# Patient Record
Sex: Male | Born: 1975 | Race: Black or African American | Hispanic: No | Marital: Married
Health system: Midwestern US, Community
[De-identification: ages and names within clinical notes are randomized; demographics above are authoritative.]

## PROBLEM LIST (undated history)

## (undated) MED ORDER — AMOXICILLIN 500 MG CAP
500 mg | ORAL_CAPSULE | Freq: Three times a day (TID) | ORAL | Status: AC
Start: ? — End: 2012-06-19

---

## 2003-04-09 ENCOUNTER — Emergency Department (HOSPITAL_COMMUNITY): Admission: EM | Admit: 2003-04-09 | Discharge: 2003-04-10 | Payer: Self-pay | Admitting: Emergency Medicine

## 2004-03-08 ENCOUNTER — Emergency Department (HOSPITAL_COMMUNITY): Admission: EM | Admit: 2004-03-08 | Discharge: 2004-03-08 | Payer: Self-pay | Admitting: Emergency Medicine

## 2010-08-14 MED ORDER — NEOMYCIN-BACITRACIN-POLYMYXIN TOP. PACKET
CUTANEOUS | Status: DC
Start: 2010-08-14 — End: 2010-08-14

## 2010-08-14 MED ORDER — LIDOCAINE (PF) 10 MG/ML (1 %) IJ SOLN
10 mg/mL (1 %) | Freq: Once | INTRAMUSCULAR | Status: DC
Start: 2010-08-14 — End: 2010-08-14

## 2010-08-14 MED ORDER — HYDROCODONE-ACETAMINOPHEN 5 MG-500 MG TAB
5-500 mg | ORAL_TABLET | Freq: Four times a day (QID) | ORAL | Status: AC | PRN
Start: 2010-08-14 — End: ?

## 2010-08-14 NOTE — ED Notes (Signed)
Neosporin and wound care complete

## 2010-08-14 NOTE — ED Provider Notes (Addendum)
Patient is a 35 y.o. male presenting with skin laceration. The history is provided by the patient. No language interpreter was used.   Laceration   The incident occurred 1 to 2 hours ago. The laceration is located on the left leg. The laceration is 2 cm in size. Injury mechanism: wood staurs. Foreign body present: no. The pain is at a severity of 10/10. The pain is moderate. The pain has been constant since onset. Pertinent negatives include no numbness, no tingling and no loss of motion. The patient's last tetanus shot was less than 5 years ago.       No past medical history on file.     No past surgical history on file.      No family history on file.     History     Social History   ??? Marital Status: Single     Spouse Name: N/A     Number of Children: N/A   ??? Years of Education: N/A     Occupational History   ??? Not on file.     Social History Main Topics   ??? Smoking status: Current Some Day Smoker   ??? Smokeless tobacco: Not on file   ??? Alcohol Use: Yes   ??? Drug Use: Yes     Special: Marijuana   ??? Sexually Active:      Other Topics Concern   ??? Not on file     Social History Narrative   ??? No narrative on file                  ALLERGIES: Review of patient's allergies indicates no known allergies.      Review of Systems   Constitutional: Negative for fever and fatigue.   Eyes: Negative for visual disturbance.   Respiratory: Negative for chest tightness.    Cardiovascular: Negative for chest pain.   Gastrointestinal: Negative for abdominal pain.   Skin:        [Laceration L lower leg  Neurological: Negative for tingling and numbness.   Hematological: Negative for adenopathy.   Psychiatric/Behavioral: Negative for behavioral problems and agitation.   [all other systems reviewed and are negative        Filed Vitals:    08/14/10 1219 08/14/10 1432   BP: 114/71 112/67   Pulse: 86 89   Temp: 99.3 ??F (37.4 ??C) 98.9 ??F (37.2 ??C)   Resp: 16 16   Height: 5\' 10"  (1.778 m)    Weight: 156 lb 2 oz (70.818 kg)    SpO2: 98% 99%             Physical Exam   Constitutional: He is oriented to person, place, and time. He appears well-developed.   HENT:   Head: Normocephalic and atraumatic.   Eyes: Conjunctivae are normal.   Neck: Normal range of motion. Neck supple.   Cardiovascular: Normal rate and regular rhythm.    Pulmonary/Chest: Effort normal and breath sounds normal. He has no wheezes.   Abdominal: Soft. Bowel sounds are normal. There is no tenderness.   Musculoskeletal: Normal range of motion.   Lymphadenopathy:     He has no cervical adenopathy.   Neurological: He is alert and oriented to person, place, and time.   Skin: Skin is warm and dry.        1cm x 2.5cm laceration.L mid tibia. Overlying dermis is partially avulsed. Laceration .25cm depth no bone exposed. DNV LLE i ntact full mobility   Psychiatric: He  has a normal mood and affect. His behavior is normal. Judgment and thought content normal.        MDM    WOUND REPAIR  Date/Time: 08/14/2010 2:20 PM  Performed by: PASupervising provider: dr Edwena Blow  Location details: left leg  Wound length:2.6 - 7.5 cm  Anesthesia: local infiltration  Local anesthetic: lidocaine 1% without epinephrine  Anesthetic total: 10 ml  Foreign bodies: no foreign bodies  Irrigation solution: saline  Irrigation method: syringe  Skin closure: 5-0 nylon and Vicryl  Number of sutures: 9  Technique: simple  Dressing: antibiotic ointment and 4x4  My total time at bedside, performing this procedure was 16-30 minutes.

## 2010-08-14 NOTE — ED Notes (Signed)
Patient (s) Jonathan Ashley given copy of dc instructions and 1 script(s).  Patient (s) Jonathan Ashley verbalized understanding of instructions and script (s). Patient (s)Jonathan Ashley verbalized understanding of the importance of discussing medications with  his or her physician or clinic they will be following up with.  Patient alert and oriented and in no acute distress.  Patient discharged home ambulatory with self.

## 2010-08-14 NOTE — ED Notes (Cosign Needed)
Dressing bacitracin applied to sutured wound

## 2010-08-15 NOTE — ED Provider Notes (Signed)
I have personally seen and evaluated patient. I find the patient's history and physical exam are consistent with the PA's NP documentation. I agree with the care provided, treatments rendered, disposition and follow up plan.

## 2012-06-09 NOTE — Progress Notes (Signed)
Pt co sinus pressure, pain, chills, body aches, with nausea and slight fever x 1 day.  Pt was seen at the ER and needs a note to go back to work.

## 2012-06-09 NOTE — Progress Notes (Signed)
Subjective:      37 yo with cold symptoms and wanting to be cleared to return to work (did not bring form today)    Reports that he woke up this past Monday (5 days) and couldn't lift his arm went to work and arm started tightening up (right arm) and just left work and went to the emergency department at [employer], was diagnosed with a pinched nerve and was given a steroid rx and hydrocodone and was told to be out of work until Friday. Here to be cleared for work. Supposed to be going back to work Monday.     Works for [employer]    Also woke this morning with fever, cough, nasal congestion, nausea and emesis (had an episode of emesis while at the [employer])    No Known Allergies  No past medical history on file.  No past surgical history on file.  No family history on file.  History   Substance Use Topics   ??? Smoking status: Current Every Day Smoker -- 5.00 packs/day     Types: Cigarettes, Cigars   ??? Smokeless tobacco: Not on file   ??? Alcohol Use: Yes      Comment: 2 beers a day      Review of Systems    Pertinent items are noted in HPI.     Objective:     BP 132/74   Pulse 95   Temp(Src) 99.1 ??F (37.3 ??C) (Oral)   Ht 5' 8.75" (1.746 m)   Wt 162 lb (73.483 kg)   BMI 24.1 kg/m2   General appearance: fatigued, appears stated age  Eyes: conjunctivae/corneas clear.   Ears: right TM injected with effusion, left TM wnl, left effusion present  Nose: Nares normal. Septum midline. Mucosa normal. Opaque drainage, nasal turbinates red and swollen  Throat: posterior pharyngeal erythema  Lungs: clear to auscultation bilaterally  Heart: regular rate and rhythm, S1, S2 normal, no murmur, click, rub or gallop    Assessment/Plan:     1. Viral URI with cough     2. Otitis media of right ear  amoxicillin (AMOXIL) 500 mg capsule     Follow-up Disposition:  Return in about 1 day (around 06/11/2012) for eval re: neck pain.     Viral uri with right otitis media  Work note to return after Monday for eval with cav (pt to bring  paperwork required for him to return to work)  Pt to sign medical release to get records of evaluation from emergency department, should be clear to return to work if pain-free  Normal range of motion and normal strength

## 2012-06-09 NOTE — Progress Notes (Signed)
Statements below were documented by Dorien Chihuahua RN.  At the discharge station per provider the following was performed: Pt signed authorization for release of medical records from [facility].  Sent pt to registration to schedule f/u appt for Monday and reminded pt he must have return to work form with him when he returns.  Pt indicated understanding and had no questions.  Meeyah Ovitt Concha Se, RN
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
## 2012-06-11 NOTE — Telephone Encounter (Signed)
Patient called stating he's running a few minutes late for his 11 a.m. appointment today.    Thanks,

## 2014-03-28 ENCOUNTER — Encounter (HOSPITAL_BASED_OUTPATIENT_CLINIC_OR_DEPARTMENT_OTHER): Payer: Self-pay | Admitting: Emergency Medicine

## 2014-03-28 ENCOUNTER — Emergency Department (HOSPITAL_BASED_OUTPATIENT_CLINIC_OR_DEPARTMENT_OTHER): Payer: Self-pay

## 2014-03-28 ENCOUNTER — Emergency Department (HOSPITAL_BASED_OUTPATIENT_CLINIC_OR_DEPARTMENT_OTHER)
Admission: EM | Admit: 2014-03-28 | Discharge: 2014-03-28 | Disposition: A | Discharge status: Home / Self Care | Payer: Self-pay | Attending: Emergency Medicine | Admitting: Emergency Medicine

## 2014-03-28 DIAGNOSIS — X58XXXA Exposure to other specified factors, initial encounter: Secondary | ICD-10-CM | POA: Insufficient documentation

## 2014-03-28 DIAGNOSIS — S8992XA Unspecified injury of left lower leg, initial encounter: Secondary | ICD-10-CM | POA: Insufficient documentation

## 2014-03-28 DIAGNOSIS — Z720 Tobacco use: Secondary | ICD-10-CM | POA: Insufficient documentation

## 2014-03-28 DIAGNOSIS — Y9289 Other specified places as the place of occurrence of the external cause: Secondary | ICD-10-CM | POA: Insufficient documentation

## 2014-03-28 DIAGNOSIS — Y9372 Activity, wrestling: Secondary | ICD-10-CM | POA: Insufficient documentation

## 2014-03-28 DIAGNOSIS — Y998 Other external cause status: Secondary | ICD-10-CM | POA: Insufficient documentation

## 2014-03-28 MED ORDER — OXYCODONE-ACETAMINOPHEN 5-325 MG PO TABS: 1.0000 | ORAL_TABLET | Freq: Four times a day (QID) | ORAL | Status: DC | PRN

## 2014-03-28 MED ORDER — MORPHINE SULFATE 4 MG/ML IJ SOLN
4.0000 mg | Freq: Once | INTRAMUSCULAR | Status: AC
  Administered 2014-03-28: 4 mg via INTRAVENOUS
  Filled 2014-03-28: qty 1

## 2014-03-28 MED ORDER — ONDANSETRON HCL 4 MG/2ML IJ SOLN
4.0000 mg | Freq: Once | INTRAMUSCULAR | Status: AC
  Administered 2014-03-28: 4 mg via INTRAVENOUS

## 2014-03-28 MED ORDER — ONDANSETRON HCL 4 MG/2ML IJ SOLN
INTRAMUSCULAR | Status: AC
  Filled 2014-03-28: qty 2

## 2014-03-28 NOTE — Discharge Instructions (Signed)
Take motrin for pain.   Take percocet for severe pain.   Stay hydrated.   Keep off your left leg. Use crutches.   See orthopedic doctor in a week to get follow up. You may need MRI.   Return to ER if you have severe pain, leg numbness.

## 2014-03-28 NOTE — ED Provider Notes (Signed)
CSN: [id number]     Arrival date & time 03/28/14  [id number] History   First MD Initiated Contact with Patient 03/28/14 [phone]     Chief Complaint  Patient presents with  . Knee Injury     (Consider location/radiation/quality/duration/timing/severity/associated sxs/prior Treatment) The history is provided by the patient.  Herbert Shelton is a 38 y.o. male here with left knee pain. Was drinking alcohol with friends yesterday and was wrestling. Denies falls or injury on the knee. States that he may have twisted left knee. Denies head or neck or chest or abdominal injury. Woke up this morning with severe left knee pain.    No past medical history on file. No past surgical history on file. History reviewed. No pertinent family history. History  Substance Use Topics  . Smoking status: Current Every Day Smoker  . Smokeless tobacco: Not on file  . Alcohol Use: Not on file    Review of Systems  Musculoskeletal:       L knee pain   All other systems reviewed and are negative.     Allergies  Review of patient's allergies indicates no known allergies.  Home Medications   Prior to Admission medications   Not on File   BP 141/99 mmHg  Pulse 90  Temp(Src) 98.3 F (36.8 C) (Oral)  Resp 24  SpO2 100% Physical Exam  Constitutional: He is oriented to person, place, and time.  Uncomfortable   HENT:  Head: Normocephalic and atraumatic.  Mouth/Throat: Oropharynx is clear and moist.  Eyes: Conjunctivae are normal. Pupils are equal, round, and reactive to light.  Neck: Normal range of motion.  Cardiovascular: Normal rate, regular rhythm and normal heart sounds.   Pulmonary/Chest: Effort normal and breath sounds normal. No respiratory distress. He has no wheezes. He has no rales.  Abdominal: Soft. Bowel sounds are normal. He exhibits no distension. There is no tenderness. There is no rebound.  Musculoskeletal:  L knee swollen, tender medial aspect. Effusion medial aspect. Difficult to  range due to pain. Able to dorsiflex and plantar flex. No tenderness tib/fib or femur or hip. Pelvis stable. No other obvious extremity injuries   Neurological: He is alert and oriented to person, place, and time.  Skin: Skin is warm and dry.  Psychiatric: He has a normal mood and affect. His behavior is normal. Judgment and thought content normal.  Nursing note and vitals reviewed.   ED Course  Procedures (including critical care time) Labs Review Labs Reviewed - No data to display  Imaging Review Dg Knee Complete 4 Views Left  03/28/2014   CLINICAL DATA:  Left knee pain following injury, initial encounter  EXAM: LEFT KNEE - COMPLETE 4+ VIEW  COMPARISON:  None.  FINDINGS: There is no evidence of fracture, dislocation, or joint effusion. There is no evidence of arthropathy or other focal bone abnormality. Soft tissues are unremarkable.  IMPRESSION: No acute abnormality noted.   Electronically Signed   By: Alcide CleverMark  Lukens M.D.   On: 03/28/2014 08:03     EKG Interpretation None      MDM   Final diagnoses:  Knee injury, left, initial encounter    Herbert Shelton is a 38 y.o. male here with L knee pain. Likely meniscus injury vs ligamentous injury. Will get xrays to r/o obvious fractures. I doubt tibial plateau fracture as he didn't actually fall on the knee. Will likely place on knee brace and give crutches and ortho f/u.   8:20 AM Xray unremarkable. Placed on knee immobilizer,  given crutches. Told him to see ortho to get MRI to look at ligaments and meniscus.     Richardean Canalavid H Yao, MD 03/28/14 [phone]

## 2014-03-28 NOTE — ED Notes (Signed)
Pt given ice pack

## 2014-03-28 NOTE — ED Notes (Signed)
Left knee injury while playing around yesterday.  Pt having severe pain to left knee.  Some swelling noted, no deformities.

## 2014-04-08 ENCOUNTER — Ambulatory Visit (INDEPENDENT_AMBULATORY_CARE_PROVIDER_SITE_OTHER): Payer: Self-pay | Admitting: Family Medicine

## 2014-04-08 ENCOUNTER — Encounter: Payer: Self-pay | Admitting: Family Medicine

## 2014-04-08 VITALS — BP 128/73 | HR 101 | Ht 70.0 in | Wt 175.0 lb

## 2014-04-08 DIAGNOSIS — S8992XA Unspecified injury of left lower leg, initial encounter: Secondary | ICD-10-CM

## 2014-04-08 MED ORDER — OXYCODONE-ACETAMINOPHEN 5-325 MG PO TABS: 1.0000 | ORAL_TABLET | Freq: Four times a day (QID) | ORAL | Status: DC | PRN

## 2014-04-08 NOTE — Patient Instructions (Signed)
I'm concerned you tore your ACL and medial meniscus. We will go ahead with an MRI to assess - i'll call you the business day after this. Wear immobilizer but come out of this 2-3 times a day to work on motion, straight leg raises, quad flexion as we showed. Ok to take this off for the exercises, to ice 15 minutes 3-4 times a day. ACE wrap under the immobilizer. Elevate above the level of your heart to help with swelling. Ibuprofen 600mg  three times a day with food for pain and inflammation. Oxycodone every 6 hours as needed for severe pain (no driving on this).

## 2014-04-09 ENCOUNTER — Other Ambulatory Visit: Payer: Self-pay | Admitting: Family Medicine

## 2014-04-09 DIAGNOSIS — S8992XA Unspecified injury of left lower leg, initial encounter: Secondary | ICD-10-CM | POA: Insufficient documentation

## 2014-04-09 DIAGNOSIS — Z9889 Other specified postprocedural states: Secondary | ICD-10-CM

## 2014-04-09 NOTE — Assessment & Plan Note (Signed)
consistent with likely ACL, medial meniscus tears and MCL sprain.  Will move forward with MRI.  Icing, ACE wrap, motion exercises, elevation in meantime.  Immobilizer as needed for stability.  Quad strengthening exercises reviewed also.  Ibuprofen, oxycodone.

## 2014-04-09 NOTE — Progress Notes (Addendum)
PCP: No primary care provider on file.  Subjective:   HPI: Patient is a 39 y.o. male here for left knee injury.  Patient reports on 12/24 he was horsing around with some friends when he believes he twisted his left knee. Felt it give out. Lots of swelling. Went to ED and had x-rays that were negative. Placed in immobilizer with crutches and referred here. Been taking ibuprofen and oxycodone. No prior injuries.  No past medical history on file.  No current outpatient prescriptions on file prior to visit.   No current facility-administered medications on file prior to visit.    No past surgical history on file.  No Known Allergies  History   Social History  . Marital Status: Single    Spouse Name: N/A    Number of Children: N/A  . Years of Education: N/A   Occupational History  . Not on file.   Social History Main Topics  . Smoking status: Current Every Day Smoker    Types: Cigars  . Smokeless tobacco: Not on file     Comment: 2-3 cigars per day  . Alcohol Use: Not on file  . Drug Use: Not on file  . Sexual Activity: Not on file   Other Topics Concern  . Not on file   Social History Narrative    No family history on file.  BP 128/73 mmHg  Pulse 101  Ht 5\' 10"  (1.778 m)  Wt 175 lb (79.379 kg)  BMI 25.11 kg/m2  Review of Systems: See HPI above.    Objective:  Physical Exam:  Gen: NAD  Left knee: Mod effusion.  No bruising, other deformity. TTP medial joint line and suprapatellar pouch.  No lateral, other tenderness. Full extension.  Can only flex to about 50 degrees due to pain, stiffness. Unable to adequately assess ant drawer and post drawer.  2+ lachmanns.  Pain with valgus testing.   Pain with a modified mcmurrays and apleys.  Negative patellar apprehension.   NV intact distally.    Assessment & Plan:  1. Left knee injury - consistent with likely ACL, medial meniscus tears and MCL sprain.  Will move forward with MRI.  Icing, ACE wrap,  motion exercises, elevation in meantime.  Immobilizer as needed for stability.  Quad strengthening exercises reviewed also.  Ibuprofen, oxycodone.    Addendum:  MRI was reviewed and discussed with patient - Evidence that he had dislocated his patella based on disruption of MPFL, VMO stripping, contusions of femoral condyle and medial patellar facet.  He is already 3 weeks out from this injury having used immobilizer.  Will switch to a knee brace for support, start physical therapy to regain motion and strength slowly (start with quad work, not flexing beyond 90 degrees).  F/u when he is 6 weeks out.

## 2014-04-09 NOTE — Addendum Note (Signed)
Addended by: Kathi SimpersWISE, Taylinn Brabant F on: 04/09/2014 10:15 AM   Modules accepted: Orders

## 2014-04-12 ENCOUNTER — Ambulatory Visit (HOSPITAL_BASED_OUTPATIENT_CLINIC_OR_DEPARTMENT_OTHER)
Admission: RE | Admit: 2014-04-12 | Discharge: 2014-04-12 | Disposition: A | Discharge status: Home / Self Care | Payer: Managed Care, Other (non HMO) | Source: Ambulatory Visit | Attending: Family Medicine | Admitting: Family Medicine

## 2014-04-12 DIAGNOSIS — Z9889 Other specified postprocedural states: Secondary | ICD-10-CM

## 2014-04-12 DIAGNOSIS — S8982XA Other specified injuries of left lower leg, initial encounter: Secondary | ICD-10-CM | POA: Diagnosis not present

## 2014-04-12 DIAGNOSIS — M25562 Pain in left knee: Secondary | ICD-10-CM | POA: Insufficient documentation

## 2014-04-12 DIAGNOSIS — Y9372 Activity, wrestling: Secondary | ICD-10-CM | POA: Diagnosis not present

## 2014-04-12 DIAGNOSIS — S8992XA Unspecified injury of left lower leg, initial encounter: Secondary | ICD-10-CM

## 2014-04-15 NOTE — Addendum Note (Signed)
Addended by: Kathi SimpersWISE, Shamir Sedlar F on: 04/15/2014 12:02 PM   Modules accepted: Orders

## 2014-04-18 NOTE — Addendum Note (Signed)
Addended by: Lenda KelpHUDNALL, Hassani Sliney R on: 04/18/2014 10:42 AM   Modules accepted: Kipp BroodSmartSet

## 2014-04-20 ENCOUNTER — Emergency Department (HOSPITAL_BASED_OUTPATIENT_CLINIC_OR_DEPARTMENT_OTHER)
Admission: EM | Admit: 2014-04-20 | Discharge: 2014-04-20 | Disposition: A | Discharge status: Home / Self Care | Payer: Managed Care, Other (non HMO) | Attending: Emergency Medicine | Admitting: Emergency Medicine

## 2014-04-20 ENCOUNTER — Encounter (HOSPITAL_BASED_OUTPATIENT_CLINIC_OR_DEPARTMENT_OTHER): Payer: Self-pay | Admitting: *Deleted

## 2014-04-20 DIAGNOSIS — Z720 Tobacco use: Secondary | ICD-10-CM | POA: Insufficient documentation

## 2014-04-20 DIAGNOSIS — M25562 Pain in left knee: Secondary | ICD-10-CM | POA: Diagnosis not present

## 2014-04-20 DIAGNOSIS — Z760 Encounter for issue of repeat prescription: Secondary | ICD-10-CM

## 2014-04-20 MED ORDER — OXYCODONE-ACETAMINOPHEN 5-325 MG PO TABS: 1.0000 | ORAL_TABLET | Freq: Four times a day (QID) | ORAL | Status: DC | PRN

## 2014-04-20 MED ORDER — IBUPROFEN 800 MG PO TABS: 800.0000 mg | ORAL_TABLET | Freq: Three times a day (TID) | ORAL | Status: DC | PRN

## 2014-04-20 NOTE — ED Notes (Signed)
Pt reports he was seen here Christmas for left knee injury- is followed by Dr Pearletha ForgeHudnall- out of pain meds

## 2014-04-20 NOTE — Discharge Instructions (Signed)
Take ibuprofen as prescribed. Take percocet for severe pain only. No driving or operating heavy machinery while taking percocet. This medication may cause drowsiness.  Knee Pain The knee is the complex joint between your thigh and your lower leg. It is made up of bones, tendons, ligaments, and cartilage. The bones that make up the knee are:  The femur in the thigh.  The tibia and fibula in the lower leg.  The patella or kneecap riding in the groove on the lower femur. CAUSES  Knee pain is a common complaint with many causes. A few of these causes are:  Injury, such as:  A ruptured ligament or tendon injury.  Torn cartilage.  Medical conditions, such as:  Gout  Arthritis  Infections  Overuse, over training, or overdoing a physical activity. Knee pain can be minor or severe. Knee pain can accompany debilitating injury. Minor knee problems often respond well to self-care measures or get well on their own. More serious injuries may need medical intervention or even surgery. SYMPTOMS The knee is complex. Symptoms of knee problems can vary widely. Some of the problems are:  Pain with movement and weight bearing.  Swelling and tenderness.  Buckling of the knee.  Inability to straighten or extend your knee.  Your knee locks and you cannot straighten it.  Warmth and redness with pain and fever.  Deformity or dislocation of the kneecap. DIAGNOSIS  Determining what is wrong may be very straight forward such as when there is an injury. It can also be challenging because of the complexity of the knee. Tests to make a diagnosis may include:  Your caregiver taking a history and doing a physical exam.  Routine X-rays can be used to rule out other problems. X-rays will not reveal a cartilage tear. Some injuries of the knee can be diagnosed by:  Arthroscopy a surgical technique by which a small video camera is inserted through tiny incisions on the sides of the knee. This procedure  is used to examine and repair internal knee joint problems. Tiny instruments can be used during arthroscopy to repair the torn knee cartilage (meniscus).  Arthrography is a radiology technique. A contrast liquid is directly injected into the knee joint. Internal structures of the knee joint then become visible on X-ray film.  An MRI scan is a non X-ray radiology procedure in which magnetic fields and a computer produce two- or three-dimensional images of the inside of the knee. Cartilage tears are often visible using an MRI scanner. MRI scans have largely replaced arthrography in diagnosing cartilage tears of the knee.  Blood work.  Examination of the fluid that helps to lubricate the knee joint (synovial fluid). This is done by taking a sample out using a needle and a syringe. TREATMENT The treatment of knee problems depends on the cause. Some of these treatments are:  Depending on the injury, proper casting, splinting, surgery, or physical therapy care will be needed.  Give yourself adequate recovery time. Do not overuse your joints. If you begin to get sore during workout routines, back off. Slow down or do fewer repetitions.  For repetitive activities such as cycling or running, maintain your strength and nutrition.  Alternate muscle groups. For example, if you are a weight lifter, work the upper body on one day and the lower body the next.  Either tight or weak muscles do not give the proper support for your knee. Tight or weak muscles do not absorb the stress placed on the knee joint.  Keep the muscles surrounding the knee strong.  Take care of mechanical problems.  If you have flat feet, orthotics or special shoes may help. See your caregiver if you need help.  Arch supports, sometimes with wedges on the inner or outer aspect of the heel, can help. These can shift pressure away from the side of the knee most bothered by osteoarthritis.  A brace called an "unloader" brace also may be  used to help ease the pressure on the most arthritic side of the knee.  If your caregiver has prescribed crutches, braces, wraps or ice, use as directed. The acronym for this is PRICE. This means protection, rest, ice, compression, and elevation.  Nonsteroidal anti-inflammatory drugs (NSAIDs), can help relieve pain. But if taken immediately after an injury, they may actually increase swelling. Take NSAIDs with food in your stomach. Stop them if you develop stomach problems. Do not take these if you have a history of ulcers, stomach pain, or bleeding from the bowel. Do not take without your caregiver's approval if you have problems with fluid retention, heart failure, or kidney problems.  For ongoing knee problems, physical therapy may be helpful.  Glucosamine and chondroitin are over-the-counter dietary supplements. Both may help relieve the pain of osteoarthritis in the knee. These medicines are different from the usual anti-inflammatory drugs. Glucosamine may decrease the rate of cartilage destruction.  Injections of a corticosteroid drug into your knee joint may help reduce the symptoms of an arthritis flare-up. They may provide pain relief that lasts a few months. You may have to wait a few months between injections. The injections do have a small increased risk of infection, water retention, and elevated blood sugar levels.  Hyaluronic acid injected into damaged joints may ease pain and provide lubrication. These injections may work by reducing inflammation. A series of shots may give relief for as long as 6 months.  Topical painkillers. Applying certain ointments to your skin may help relieve the pain and stiffness of osteoarthritis. Ask your pharmacist for suggestions. Many over the-counter products are approved for temporary relief of arthritis pain.  In some countries, doctors often prescribe topical NSAIDs for relief of chronic conditions such as arthritis and tendinitis. A review of  treatment with NSAID creams found that they worked as well as oral medications but without the serious side effects. PREVENTION  Maintain a healthy weight. Extra pounds put more strain on your joints.  Get strong, stay limber. Weak muscles are a common cause of knee injuries. Stretching is important. Include flexibility exercises in your workouts.  Be smart about exercise. If you have osteoarthritis, chronic knee pain or recurring injuries, you may need to change the way you exercise. This does not mean you have to stop being active. If your knees ache after jogging or playing basketball, consider switching to swimming, water aerobics, or other low-impact activities, at least for a few days a week. Sometimes limiting high-impact activities will provide relief.  Make sure your shoes fit well. Choose footwear that is right for your sport.  Protect your knees. Use the proper gear for knee-sensitive activities. Use kneepads when playing volleyball or laying carpet. Buckle your seat belt every time you drive. Most shattered kneecaps occur in car accidents.  Rest when you are tired. SEEK MEDICAL CARE IF:  You have knee pain that is continual and does not seem to be getting better.  SEEK IMMEDIATE MEDICAL CARE IF:  Your knee joint feels hot to the touch and you have a  high fever. MAKE SURE YOU:   Understand these instructions.  Will watch your condition.  Will get help right away if you are not doing well or get worse. Document Released: 01/16/2007 Document Revised: 06/13/2011 Document Reviewed: 01/16/2007 [facility] Patient Information 2015 [place], [place]. This information is not intended to replace advice given to you by your health care provider. Make sure you discuss any questions you have with your health care provider.  Medication Refill, Emergency Department We have refilled your medication today as a courtesy to you. It is best for your medical care, however, to take care of getting  refills done through your primary caregiver's office. They have your records and can do a better job of follow-up than we can in the emergency department. On maintenance medications, we often only prescribe enough medications to get you by until you are able to see your regular caregiver. This is a more expensive way to refill medications. In the future, please plan for refills so that you will not have to use the emergency department for this. Thank you for your help. Your help allows us to better take care of the daily emergencies that enter our department. Document Released: 07/08/2003 Document Revised: 06/13/2011 Document Reviewed: 06/28/2013 [facility] Patient Information 2015 [place], [place]. This information is not intended to replace advice given to you by your health care provider. Make sure you discuss any questions you have with your health care provider.

## 2014-04-20 NOTE — ED Provider Notes (Signed)
CSN: [id number]     Arrival date & time 04/20/14  1706 History   First MD Initiated Contact with Patient 04/20/14 1707     Chief Complaint  Patient presents with  . Medication Refill     (Consider location/radiation/quality/duration/timing/severity/associated sxs/prior Treatment) HPI Comments: 39 year old male presenting requesting a medication refill on Percocet. Patient was seen in the emergency department on 03/28/14 with left knee pain after an injury and advised to follow-up with orthopedics. Patient followed up with Dr. Pearletha Forge on 04/08/2014, had an MRI and 04/12/2014 which showed concerns of a patella dislocation injury. He was placed in knee brace described Percocet. Patient reports he ran out of the Percocet 2 days ago. He tried calling Dr. Lazaro Arms office, however no one was there at the time and has not received a phone call back. He states he has been taking Percocet and ibuprofen. No new injury or trauma.  The history is provided by the patient.    History reviewed. No pertinent past medical history. History reviewed. No pertinent past surgical history. No family history on file. History  Substance Use Topics  . Smoking status: Current Every Day Smoker    Types: Cigars, Cigarettes  . Smokeless tobacco: Never Used     Comment: 2-3 cigars per day  . Alcohol Use: Yes     Comment: occasional    Review of Systems  Constitutional: Negative.   HENT: Negative.   Respiratory: Negative.   Musculoskeletal:       + L knee pain.  Skin: Negative.   Neurological: Negative for numbness.      Allergies  Review of patient's allergies indicates no known allergies.  Home Medications   Prior to Admission medications   Medication Sig Start Date End Date Taking? Authorizing Provider  ibuprofen (ADVIL,MOTRIN) 800 MG tablet Take 1 tablet (800 mg total) by mouth every 8 (eight) hours as needed. 04/20/14   Kathrynn Speed, PA-C  oxyCODONE-acetaminophen (PERCOCET) 5-325 MG per tablet  Take 1-2 tablets by mouth every 6 (six) hours as needed for severe pain. 04/20/14   Dorann Davidson M Onell Mcmath, PA-C   BP 141/85 mmHg  Pulse 106  Temp(Src) 98 F (36.7 C) (Oral)  Resp 18  Ht  (1.778 m)  Wt 177 lb (80.287 kg)  BMI 25.40 kg/m2  SpO2 99% Physical Exam  Constitutional: He is oriented to person, place, and time. He appears well-developed and well-nourished. No distress.  HENT:  Head: Normocephalic and atraumatic.  Eyes: Conjunctivae and EOM are normal.  Neck: Normal range of motion. Neck supple.  Cardiovascular: Normal rate, regular rhythm and normal heart sounds.   Pulmonary/Chest: Effort normal and breath sounds normal.  Musculoskeletal:  L knee swollen around patella, medially and laterally. Tender throughout. No erythema or warmth. ROM limited due to pain.  Neurological: He is alert and oriented to person, place, and time.  Skin: Skin is warm and dry.  Psychiatric: He has a normal mood and affect. His behavior is normal.  Nursing note and vitals reviewed.   ED Course  Procedures (including critical care time) Labs Review Labs Reviewed - No data to display  Imaging Review No results found.   EKG Interpretation None      MDM   Final diagnoses:  Medication refill  Knee pain, left   Patient in no apparent distress. Vital signs stable. No tachycardia on my examination. On chart review, patient was prescribed 60 tablets, a 7 day supply of Percocet on 04/08/2014. I discussed with patient that  I can't prescribe only a short course of this medication and he will need to follow-up with Dr. Pearletha ForgeHudnall. Prescription for 10 Percocet given along with 800 mg ibuprofen. Stable for discharge. Return precautions given. Patient states understanding of treatment care plan and is agreeable.  Kathrynn SpeedRobyn M Jaelah Hauth, PA-C 04/20/14 1732  Ethelda ChickMartha K Linker, MD 04/20/14 [phone]

## 2014-04-21 ENCOUNTER — Other Ambulatory Visit: Payer: Self-pay | Admitting: Family Medicine

## 2014-04-21 MED ORDER — HYDROCODONE-ACETAMINOPHEN 5-325 MG PO TABS: 1.0000 | ORAL_TABLET | Freq: Four times a day (QID) | ORAL | Status: DC | PRN

## 2014-04-21 NOTE — Progress Notes (Signed)
Patient called requesting pain medication refill.  Will step down to Norco from Percocet, no refills.  If he requests additional medication would then switch to tramadol.  Narcotics database reviewed prior to script.

## 2014-04-22 ENCOUNTER — Ambulatory Visit: Payer: Managed Care, Other (non HMO) | Attending: Family Medicine

## 2014-04-22 DIAGNOSIS — M25562 Pain in left knee: Secondary | ICD-10-CM | POA: Insufficient documentation

## 2014-04-22 DIAGNOSIS — M25662 Stiffness of left knee, not elsewhere classified: Secondary | ICD-10-CM | POA: Insufficient documentation

## 2014-04-22 DIAGNOSIS — S8992XD Unspecified injury of left lower leg, subsequent encounter: Secondary | ICD-10-CM | POA: Insufficient documentation

## 2014-04-24 ENCOUNTER — Ambulatory Visit: Payer: Managed Care, Other (non HMO) | Admitting: Rehabilitation

## 2014-04-24 DIAGNOSIS — S8992XD Unspecified injury of left lower leg, subsequent encounter: Secondary | ICD-10-CM | POA: Diagnosis not present

## 2014-04-28 ENCOUNTER — Ambulatory Visit: Payer: Managed Care, Other (non HMO)

## 2014-04-28 DIAGNOSIS — S8992XD Unspecified injury of left lower leg, subsequent encounter: Secondary | ICD-10-CM | POA: Diagnosis not present

## 2014-05-01 ENCOUNTER — Other Ambulatory Visit: Payer: Self-pay | Admitting: Family Medicine

## 2014-05-01 ENCOUNTER — Ambulatory Visit: Payer: Managed Care, Other (non HMO) | Admitting: Rehabilitation

## 2014-05-01 DIAGNOSIS — S8992XD Unspecified injury of left lower leg, subsequent encounter: Secondary | ICD-10-CM | POA: Diagnosis not present

## 2014-05-01 MED ORDER — IBUPROFEN 800 MG PO TABS: 800.0000 mg | ORAL_TABLET | Freq: Three times a day (TID) | ORAL | Status: DC | PRN

## 2014-05-01 MED ORDER — TRAMADOL HCL 50 MG PO TABS: 50.0000 mg | ORAL_TABLET | Freq: Three times a day (TID) | ORAL | Status: DC | PRN

## 2014-05-01 NOTE — Progress Notes (Signed)
Patient requesting refills on medicines - rx tramadol and ibuprofen.

## 2014-05-05 ENCOUNTER — Ambulatory Visit: Payer: Managed Care, Other (non HMO)

## 2014-05-08 ENCOUNTER — Ambulatory Visit: Payer: Managed Care, Other (non HMO) | Attending: Family Medicine

## 2014-05-08 DIAGNOSIS — M25562 Pain in left knee: Secondary | ICD-10-CM | POA: Insufficient documentation

## 2014-05-08 DIAGNOSIS — M25662 Stiffness of left knee, not elsewhere classified: Secondary | ICD-10-CM | POA: Diagnosis not present

## 2014-05-12 ENCOUNTER — Ambulatory Visit: Payer: Managed Care, Other (non HMO) | Admitting: Rehabilitation

## 2014-05-12 DIAGNOSIS — M25562 Pain in left knee: Secondary | ICD-10-CM | POA: Diagnosis not present

## 2014-05-15 ENCOUNTER — Ambulatory Visit: Payer: Managed Care, Other (non HMO)

## 2014-05-15 DIAGNOSIS — M25562 Pain in left knee: Secondary | ICD-10-CM | POA: Diagnosis not present

## 2014-05-16 ENCOUNTER — Emergency Department (HOSPITAL_BASED_OUTPATIENT_CLINIC_OR_DEPARTMENT_OTHER)
Admission: EM | Admit: 2014-05-16 | Discharge: 2014-05-16 | Disposition: A | Discharge status: Home / Self Care | Payer: Managed Care, Other (non HMO) | Attending: Emergency Medicine | Admitting: Emergency Medicine

## 2014-05-16 ENCOUNTER — Encounter (HOSPITAL_BASED_OUTPATIENT_CLINIC_OR_DEPARTMENT_OTHER): Payer: Self-pay | Admitting: Emergency Medicine

## 2014-05-16 DIAGNOSIS — Z720 Tobacco use: Secondary | ICD-10-CM | POA: Diagnosis not present

## 2014-05-16 DIAGNOSIS — J069 Acute upper respiratory infection, unspecified: Secondary | ICD-10-CM | POA: Insufficient documentation

## 2014-05-16 DIAGNOSIS — G8929 Other chronic pain: Secondary | ICD-10-CM | POA: Insufficient documentation

## 2014-05-16 DIAGNOSIS — M25562 Pain in left knee: Secondary | ICD-10-CM | POA: Diagnosis not present

## 2014-05-16 DIAGNOSIS — Z87828 Personal history of other (healed) physical injury and trauma: Secondary | ICD-10-CM | POA: Diagnosis not present

## 2014-05-16 DIAGNOSIS — R05 Cough: Secondary | ICD-10-CM | POA: Diagnosis present

## 2014-05-16 DIAGNOSIS — M2550 Pain in unspecified joint: Secondary | ICD-10-CM

## 2014-05-16 DIAGNOSIS — A64 Unspecified sexually transmitted disease: Secondary | ICD-10-CM | POA: Diagnosis not present

## 2014-05-16 MED ORDER — CEFTRIAXONE SODIUM 250 MG IJ SOLR
250.0000 mg | Freq: Once | INTRAMUSCULAR | Status: AC
  Administered 2014-05-16: 250 mg via INTRAMUSCULAR
  Filled 2014-05-16: qty 250

## 2014-05-16 MED ORDER — TRAMADOL HCL 50 MG PO TABS: 50.0000 mg | ORAL_TABLET | Freq: Four times a day (QID) | ORAL | Status: DC | PRN

## 2014-05-16 MED ORDER — AZITHROMYCIN 250 MG PO TABS
1000.0000 mg | ORAL_TABLET | Freq: Once | ORAL | Status: AC
Start: 2014-05-16 — End: 2014-05-16
  Administered 2014-05-16: 1000 mg via ORAL
  Filled 2014-05-16: qty 4

## 2014-05-16 MED ORDER — LIDOCAINE HCL (PF) 1 % IJ SOLN
INTRAMUSCULAR | Status: AC
  Administered 2014-05-16: 2.3 mL
  Filled 2014-05-16: qty 5

## 2014-05-16 NOTE — ED Notes (Signed)
Pt has cough for 2 weeks.  No fever.  Some sore throat.

## 2014-05-16 NOTE — ED Provider Notes (Signed)
CSN: [id number]     Arrival date & time 05/16/14  [id number] History   None    Chief Complaint  Patient presents with  . Cough     (Consider location/radiation/quality/duration/timing/severity/associated sxs/prior Treatment) HPI Comments: Patient presents to the ER for evaluation of multiple problems. Patient reports that he has cough and chest congestion. She is not short of breath. He has had nasal and sinus congestion as well. This is been ongoing for several days.  Also reports knee pain. He has a history of chronic knee pain, has been followed by a sports medicine doctor. No new injury.  Concerned about possible STD. He reports that he has been experiencing urethral discharge and drainage. He did have sex recently with a new partner, reports that the condom broke.   No past medical history on file. No past surgical history on file. No family history on file. History  Substance Use Topics  . Smoking status: Current Every Day Smoker    Types: Cigars, Cigarettes  . Smokeless tobacco: Never Used     Comment: 2-3 cigars per day  . Alcohol Use: Yes     Comment: occasional    Review of Systems  Respiratory: Positive for cough.   Genitourinary: Positive for discharge.  Musculoskeletal: Positive for arthralgias.  All other systems reviewed and are negative.     Allergies  Review of patient's allergies indicates no known allergies.  Home Medications   Prior to Admission medications   Medication Sig Start Date End Date Taking? Authorizing Provider  traMADol (ULTRAM) 50 MG tablet Take 1 tablet (50 mg total) by mouth every 6 (six) hours as needed. 05/16/14   Gilda Creasehristopher J. Pollina, MD   BP 139/83 mmHg  Pulse 97  Temp(Src) 98.8 F (37.1 C) (Oral)  Resp 18  Ht 5\' 10"  (1.778 m)  Wt 170 lb (77.111 kg)  BMI 24.39 kg/m2  SpO2 98% Physical Exam  Constitutional: He is oriented to person, place, and time. He appears well-developed and well-nourished. No distress.  HENT:  Head:  Normocephalic and atraumatic.  Right Ear: Hearing normal.  Left Ear: Hearing normal.  Nose: Nose normal.  Mouth/Throat: Oropharynx is clear and moist and mucous membranes are normal.  Eyes: Conjunctivae and EOM are normal. Pupils are equal, round, and reactive to light.  Neck: Normal range of motion. Neck supple.  Cardiovascular: Regular rhythm, S1 normal and S2 normal.  Exam reveals no gallop and no friction rub.   No murmur heard. Pulmonary/Chest: Effort normal and breath sounds normal. No respiratory distress. He exhibits no tenderness.  Abdominal: Soft. Normal appearance and bowel sounds are normal. There is no hepatosplenomegaly. There is no tenderness. There is no rebound, no guarding, no tenderness at McBurney's point and negative Murphy's sign. No hernia.  Genitourinary: Testes normal. Circumcised. Discharge found.  Musculoskeletal: Normal range of motion.  Neurological: He is alert and oriented to person, place, and time. He has normal strength. No cranial nerve deficit or sensory deficit. Coordination normal. GCS eye subscore is 4. GCS verbal subscore is 5. GCS motor subscore is 6.  Skin: Skin is warm, dry and intact. No rash noted. No cyanosis.  Psychiatric: He has a normal mood and affect. His speech is normal and behavior is normal. Thought content normal.  Nursing note and vitals reviewed.   ED Course  Procedures (including critical care time) Labs Review Labs Reviewed - No data to display  Imaging Review No results found.   EKG Interpretation None  MDM   Final diagnoses:  URI (upper respiratory infection)  Arthralgia  STD (male)    [place] with multiple complaints. Patient has had problems with his left knee for some time. He did have a previous injury. There has not been any new injury. Patient does have evidence of urethritis and STD, but the pain has not changed. There is no redness or swelling. This is not consistent with disseminated gonococcus, but  rather chronic pain.  Administered Rocephin and Zithromax for gonorrhea and chlamydia coverage.  Lung examination is clear. Patient has no wheezing and there is no hypoxia. Vital signs are normal. Patient given a prescription for Ultram, encouraged to follow up with orthopedics and/or sports medicine.  Gilda Crease, MD 05/16/14 [phone]

## 2014-05-16 NOTE — Discharge Instructions (Signed)
Arthralgia Arthralgia is joint pain. A joint is a place where two bones meet. Joint pain can happen for many reasons. The joint can be bruised, stiff, infected, or weak from aging. Pain usually goes away after resting and taking medicine for soreness.  HOME CARE  Rest the joint as told by your doctor.  Keep the sore joint raised (elevated) for the first 24 hours.  Put ice on the joint area.  Put ice in a plastic bag.  Place a towel between your skin and the bag.  Leave the ice on for 15-20 minutes, 03-04 times a day.  Wear your splint, casting, elastic bandage, or sling as told by your doctor.  Only take medicine as told by your doctor. Do not take aspirin.  Use crutches as told by your doctor. Do not put weight on the joint until told to by your doctor. GET HELP RIGHT AWAY IF:   You have bruising, puffiness (swelling), or more pain.  Your fingers or toes turn blue or start to lose feeling (numb).  Your medicine does not lessen the pain.  Your pain becomes severe.  You have a temperature by mouth above 102 F (38.9 C), not controlled by medicine.  You cannot move or use the joint. MAKE SURE YOU:   Understand these instructions.  Will watch your condition.  Will get help right away if you are not doing well or get worse. Document Released: 03/09/2009 Document Revised: 06/13/2011 Document Reviewed: 03/09/2009 [facility] Patient Information 2015 [place], [place]. This information is not intended to replace advice given to you by your health care provider. Make sure you discuss any questions you have with your health care provider.  Sexually Transmitted Disease A sexually transmitted disease (STD) is a disease or infection often passed to another person during sex. However, STDs can be passed through nonsexual ways. An STD can be passed through:  Spit (saliva).  Semen.  Blood.  Mucus from the vagina.  Pee (urine). HOW CAN I LESSEN MY CHANCES OF GETTING AN  STD?  Use:  Latex condoms.  Water-soluble lubricants with condoms. Do not use petroleum jelly or oils.  Dental dams. These are small pieces of latex that are used as a barrier during oral sex.  Avoid having more than one sex partner.  Do not have sex with someone who has other sex partners.  Do not have sex with anyone you do not know or who is at high risk for an STD.  Avoid risky sex that can break your skin.  Do not have sex if you have open sores on your mouth or skin.  Avoid drinking too much alcohol or taking illegal drugs. Alcohol and drugs can affect your good judgment.  Avoid oral and anal sex acts.  Get shots (vaccines) for HPV and hepatitis.  If you are at risk of being infected with HIV, it is advised that you take a certain medicine daily to prevent HIV infection. This is called pre-exposure prophylaxis (PrEP). You may be at risk if:  You are a man who has sex with other men (MSM).  You are attracted to the opposite sex (heterosexual) and are having sex with more than one partner.  You take drugs with a needle.  You have sex with someone who has HIV.  Talk with your doctor about if you are at high risk of being infected with HIV. If you begin to take PrEP, get tested for HIV first. Get tested every 3 months for as long  as you are taking PrEP. WHAT SHOULD I DO IF I THINK I HAVE AN STD?  See your doctor.  Tell your sex partner(s) that you have an STD. They should be tested and treated.  Do not have sex until your doctor says it is okay. WHEN SHOULD I GET HELP? Get help right away if:  You have bad belly (abdominal) pain.  You are a man and have puffiness (swelling) or pain in your testicles.  You are a woman and have puffiness in your vagina. Document Released: 04/28/2004 Document Revised: 03/26/2013 Document Reviewed: 09/14/2012 [facility] Patient Information 2015 [place] Country ClubExitCare, [place]. This information is not intended to replace advice given to you by your  health care provider. Make sure you discuss any questions you have with your health care provider.  Upper Respiratory Infection, Adult An upper respiratory infection (URI) is also known as the common cold. It is often caused by a type of germ (virus). Colds are easily spread (contagious). You can pass it to others by kissing, coughing, sneezing, or drinking out of the same glass. Usually, you get better in 1 or 2 weeks.  HOME CARE   Only take medicine as told by your doctor.  Use a warm mist humidifier or breathe in steam from a hot shower.  Drink enough water and fluids to keep your pee (urine) clear or pale yellow.  Get plenty of rest.  Return to work when your temperature is back to normal or as told by your doctor. You may use a face mask and wash your hands to stop your cold from spreading. GET HELP RIGHT AWAY IF:   After the first few days, you feel you are getting worse.  You have questions about your medicine.  You have chills, shortness of breath, or brown or red spit (mucus).  You have yellow or brown snot (nasal discharge) or pain in the face, especially when you bend forward.  You have a fever, puffy (swollen) neck, pain when you swallow, or white spots in the back of your throat.  You have a bad headache, ear pain, sinus pain, or chest pain.  You have a high-pitched whistling sound when you breathe in and out (wheezing).  You have a lasting cough or cough up blood.  You have sore muscles or a stiff neck. MAKE SURE YOU:   Understand these instructions.  Will watch your condition.  Will get help right away if you are not doing well or get worse. Document Released: 09/07/2007 Document Revised: 06/13/2011 Document Reviewed: 06/26/2013 [facility] Patient Information 2015 [place], [place]. This information is not intended to replace advice given to you by your health care provider. Make sure you discuss any questions you have with your health care provider.

## 2014-05-19 ENCOUNTER — Ambulatory Visit: Payer: Managed Care, Other (non HMO) | Admitting: Rehabilitation

## 2014-05-22 ENCOUNTER — Ambulatory Visit: Payer: Managed Care, Other (non HMO)

## 2014-05-22 DIAGNOSIS — M25662 Stiffness of left knee, not elsewhere classified: Secondary | ICD-10-CM

## 2014-05-22 DIAGNOSIS — M25562 Pain in left knee: Secondary | ICD-10-CM | POA: Diagnosis not present

## 2014-05-22 NOTE — Patient Instructions (Signed)
Patient to ice at home.  Instructed to practice jump roping or jogging.  States already jogging in place some at home.  Encouraged to start 5 minutes and add on 2 minutes at a time and to ice afterwards.

## 2014-05-22 NOTE — Therapy (Signed)
[place] [address]  [place], [place], [address] Phone: [phone]   Fax:  [phone]  Physical Therapy Evaluation  Patient Details  Name: Herbert Shelton MRN: [id number] Date of Birth: 07-07-1975 Referring Provider:  Dene Gentry, MD  Encounter Date: 05/22/2014      PT End of Session - 05/22/14 0846    Visit Number 8   Number of Visits 12   Date for PT Re-Evaluation 06/06/14   PT Start Time 0807   PT Stop Time 0842   PT Time Calculation (min) 35 min   Activity Tolerance Patient tolerated treatment well   Behavior During Therapy [facility] for tasks assessed/performed      History reviewed. No pertinent past medical history.  History reviewed. No pertinent past surgical history.  There were no vitals taken for this visit.  Visit Diagnosis:  Left knee pain  Knee stiff, left      Subjective Assessment - 05/22/14 0810    Symptoms Patient reports knee is still stiff in the morning.  Reports better after stretching.  Still difficulty kneeling   Currently in Pain? No/denies          [facility] PT Assessment - 05/22/14 0001    AROM   Left Knee Extension 0   Left Knee Flexion 124                  [facility] Adult PT Treatment/Exercise - 05/22/14 0001    Exercises   Exercises Knee/Hip   Knee/Hip Exercises: Stretches   Knee: Self-Stretch to increase Flexion 10 seconds  in quadruped rocking back onto heels x 6-7 reps   Gastroc Stretch 1 rep;30 seconds  cues for tehchnique and demo   Knee/Hip Exercises: Aerobic   Stationary Bike nu step level 5 LE only x 3.5 minutes; bike level 2 forward x 5 min   Knee/Hip Exercises: Plyometrics   Other Plyometric Exercises jogging on trampoline x 2 minutes   Knee/Hip Exercises: Standing   Forward Lunges 15 reps;1 set;Both  onto BOSU   Side Lunges Both;10 reps  side stepping down track with green theraband around knees   Forward Step Up Step Height: 8";10  reps;Both;1 set   Step Down Left;Step Height: 6";10 reps  tap downs with right heel   Wall Squat 1 set;10 reps  3 sec hold; ball between knees   Other Standing Knee Exercises toe walking x 20' x 2; heel walking 20' x 2   Manual Therapy   Manual Therapy Other (comment)  McConnell Taping left knee                PT Education - 05/22/14 0845    Education provided Yes   Education Details progressing of plyometric activity at home   Person(s) Educated Patient   Methods Explanation   Comprehension Verbalized understanding          PT Short Term Goals - 05/22/14 0957    PT SHORT TERM GOAL #1   Title Pt. to report pain decrease so that tolerates sitting 20 minutes with pain 6/10 or less.  (05/16/14)   Time 3   Period Weeks   Status Achieved           PT Long Term Goals - 05/22/14 7616    PT LONG TERM GOAL #1   Title Pt. to demonstrate and/or verbalize techniques to reduce the risk of re-injury to include info on : anti-inflammatory (RICE method)  (06/06/14)  Time 6   Period Weeks   Status Achieved   PT LONG TERM GOAL #2   Title Pt. to be independent with advanced HEP for strength, balance and function.  (06/06/14)   Time 6   Period Weeks   Status On-going   PT LONG TERM GOAL #3   Title Pt to report pain decrease to no more than 4/10 with daily activities (06/06/14)   Time 6   Period Weeks   Status On-going   PT LONG TERM GOAL #4   Title Patient to increase ROM left knee flexion to at least 115 to allow reciprocal pattern on stairs.  (06/06/14)   Time 6   Period Weeks   Status Achieved   PT LONG TERM GOAL #5   Title Patient to tolerate sitting for 1 hour with pain no more than 4/10 (06/06/14)   Time 6   Period Weeks   Status Achieved   Additional Long Term Goals   Additional Long Term Goals Yes   PT LONG TERM GOAL #6   Title Patient to demo improved outcome on FOTO to no rmore than 44% limitation.  (06/06/14)   Time 6   Period Weeks   Status On-going                Plan - 05/22/14 1610    Clinical Impression Statement Patient continuing to improve, met STG # 2 and LTG #1 and 5 today.  Feel he needs continued progression to allow squatting and kneeling due to needed for vocation.  Will continue skilled PT toward all goals.   Pt will benefit from skilled therapeutic intervention in order to improve on the following deficits Decreased range of motion;Difficulty walking;Decreased activity tolerance;Pain;Decreased strength;Decreased mobility   Rehab Potential Good   PT Frequency 2x / week   PT Duration 6 weeks   PT Treatment/Interventions ADLs/Self Care Home Management;Gait training;Stair training;Functional mobility training;Patient/family education;Passive range of motion;Therapeutic activities;Cryotherapy;Electrical Stimulation;Therapeutic exercise;Manual techniques;Balance training;Moist Heat   PT Next Visit Plan Progress plyometrics as tolerated, balance on left with compliant surface, kneeling/squatting as tolerated.  Consider taping again if needed.   Consulted and Agree with Plan of Care Patient         Problem List Patient Active Problem List   Diagnosis Date Noted  . Left knee injury 04/09/2014    [facility] 05/22/2014, 10:05 AM  Magda Kiel, PT 05/22/2014   [place] [address]  [place], [place], [address] Phone: [phone]   Fax:  [phone]

## 2014-05-26 ENCOUNTER — Ambulatory Visit: Payer: Managed Care, Other (non HMO) | Admitting: Rehabilitation

## 2014-05-26 DIAGNOSIS — M25662 Stiffness of left knee, not elsewhere classified: Secondary | ICD-10-CM

## 2014-05-26 DIAGNOSIS — M25562 Pain in left knee: Secondary | ICD-10-CM

## 2014-05-26 NOTE — Therapy (Signed)
[facility] [address]  [address] [place], [place], [address] Phone: [phone]   Fax:  [phone]  Physical Therapy Treatment  Patient Details  Name: Herbert Shelton MRN: [id number] Date of Birth: Feb 09, 1976 Referring Provider:  Lenda Kelp, MD  Encounter Date: 05/26/2014      PT End of Session - 05/26/14 0846    Visit Number 9   Number of Visits 12   Date for PT Re-Evaluation 06/06/14   PT Start Time 0812   PT Stop Time 0845   PT Time Calculation (min) 33 min   Activity Tolerance Patient tolerated treatment well   Behavior During Therapy [facility] for tasks assessed/performed      No past medical history on file.  No past surgical history on file.  There were no vitals taken for this visit.  Visit Diagnosis:  Left knee pain  Knee stiff, left      Subjective Assessment - 05/26/14 0815    Symptoms Reports that he hasn't taken his meds in 2 weeks now and is feeling better. Still having some problems with kneeling/squatting. Says as soon as he can squat/kneel without problems he is good to go back to work.  Denies pain over the weekend.    Currently in Pain? No/denies          [facility] PT Assessment - 05/26/14 0001    AROM   Left Knee Extension 0   Left Knee Flexion 125  pain with full knee flexion   Strength   Left Knee Flexion 4+/5  Slight pain   Left Knee Extension 5/5   Ambulation/Gait   Ambulation/Gait Yes   Ambulation/Gait Assistance 7: Independent   Gait Pattern Within Functional Limits   Stairs --                  [facility] Adult PT Treatment/Exercise - 05/26/14 0001    Knee/Hip Exercises: Stretches   Knee: Self-Stretch to increase Flexion --  5 second holds x 10 reps. In quadruped.    Knee/Hip Exercises: Aerobic   Stationary Bike nu step level 6 LE only x 5 minutes.   Knee/Hip Exercises: Machines for Strengthening   Cybex Leg Press DL 41# x 15, SL 32# x 15   Knee/Hip Exercises:  Plyometrics   Other Plyometric Exercises jogging, jumping jack on trampoline x 30   Other Plyometric Exercises --  DL side to side on the floor x 20 seconds with 1 pain.    Knee/Hip Exercises: Standing   Forward Lunges 15 reps;1 set;Both   Side Lunges Both;10 reps  on BOSU   Forward Step Up 10 reps  on plinth   Step Down Left;Step Height: 6";10 reps;Step Height: 8"   Other Standing Knee Exercises BOSU step ups 5" hold x 10 each way. Squats on hard side x 10. HHA on counter as needed.                   PT Short Term Goals - 05/22/14 0957    PT SHORT TERM GOAL #1   Title Pt. to report pain decrease so that tolerates sitting 20 minutes with pain 6/10 or less.  (05/16/14)   Time 3   Period Weeks   Status Achieved           PT Long Term Goals - 05/26/14 4401    PT LONG TERM GOAL #3   Title Pt to report pain decrease to no more than 4/10 with  daily activities (06/06/14)   Time 6   Period Weeks   Status Achieved               Plan - 05/26/14 0847    Clinical Impression Statement Patient improving with stairs and hopping activities. Noted one instance of pain with plyometrics on the floor but none with mini tampoline. Squatting and kneeling activities are improving but patient reports these are his only limiting factors. Patient states he feels ready to go back to work once he can kneel/squat without limitations.    Pt will benefit from skilled therapeutic intervention in order to improve on the following deficits Decreased strength;Decreased range of motion;Pain   Rehab Potential Good   PT Next Visit Plan Continue plyometrics, squatting and kneeling activities.    Consulted and Agree with Plan of Care Patient        Problem List Patient Active Problem List   Diagnosis Date Noted  . Left knee injury 04/09/2014    Ronney LionDUCKER, Alicianna Litchford J 05/26/2014, 8:57 AM  [facility] [place] [address]  [address] [place], [place], [address] Phone: [phone]   Fax:  [phone]

## 2014-05-29 ENCOUNTER — Encounter: Payer: Self-pay | Admitting: Family Medicine

## 2014-05-29 ENCOUNTER — Ambulatory Visit: Payer: Managed Care, Other (non HMO)

## 2014-05-29 ENCOUNTER — Ambulatory Visit (INDEPENDENT_AMBULATORY_CARE_PROVIDER_SITE_OTHER): Payer: Managed Care, Other (non HMO) | Admitting: Family Medicine

## 2014-05-29 VITALS — BP 128/87 | HR 76 | Ht 70.0 in | Wt 175.0 lb

## 2014-05-29 DIAGNOSIS — S8992XD Unspecified injury of left lower leg, subsequent encounter: Secondary | ICD-10-CM

## 2014-05-29 NOTE — Patient Instructions (Signed)
Continue with physical therapy and home exercises for the next 4 weeks. Follow up with me at that time. See work note for restrictions.

## 2014-06-02 ENCOUNTER — Ambulatory Visit: Payer: Managed Care, Other (non HMO) | Admitting: Rehabilitation

## 2014-06-02 DIAGNOSIS — M25662 Stiffness of left knee, not elsewhere classified: Secondary | ICD-10-CM

## 2014-06-02 DIAGNOSIS — M25562 Pain in left knee: Secondary | ICD-10-CM | POA: Diagnosis not present

## 2014-06-02 NOTE — Progress Notes (Signed)
PCP: No primary care provider on file.  Subjective:   HPI: Patient is a 39 y.o. male here for left knee injury.  1/5: Patient reports on 12/24 he was horsing around with some friends when he believes he twisted his left knee. Felt it give out. Lots of swelling. Went to ED and had x-rays that were negative. Placed in immobilizer with crutches and referred here. Been taking ibuprofen and oxycodone. No prior injuries.  2/25: Patient reports his knee feels about 75% improved. Some stiffness. Doing PT and HEP which have helped. Pain worse in morning and with cold weather. Slight swelling.  No past medical history on file.  Current Outpatient Prescriptions on File Prior to Visit  Medication Sig Dispense Refill  . traMADol (ULTRAM) 50 MG tablet Take 1 tablet (50 mg total) by mouth every 6 (six) hours as needed. (Patient not taking: Reported on 05/22/2014) 15 tablet 0   No current facility-administered medications on file prior to visit.    No past surgical history on file.  No Known Allergies  History   Social History  . Marital Status: Married    Spouse Name: N/A  . Number of Children: N/A  . Years of Education: N/A   Occupational History  . Not on file.   Social History Main Topics  . Smoking status: Current Every Day Smoker    Types: Cigars, Cigarettes  . Smokeless tobacco: Never Used     Comment: 4-5 cigars per day  . Alcohol Use: 0.0 oz/week    0 Standard drinks or equivalent per week     Comment: occasional  . Drug Use: Yes    Special: Marijuana  . Sexual Activity: Not on file   Other Topics Concern  . Not on file   Social History Narrative    No family history on file.  BP 128/87 mmHg  Pulse 76  Ht 5\' 10"  (1.778 m)  Wt 175 lb (79.379 kg)  BMI 25.11 kg/m2  Review of Systems: See HPI above.    Objective:  Physical Exam:  Gen: NAD  Left knee: Minimal effusion.  No bruising, other deformity. Mild TTP medial joint line and suprapatellar  pouch.  No lateral, other tenderness. ROM 0 - 100 degrees - did not test beyond this. Negative ant drawer, post drawer.  1+ lachmanns.  Minimal pain with valgus testing.   Negative mcmurrays and apleys.  Negative patellar apprehension.   NV intact distally.    Assessment & Plan:  1. Left knee injury - MRI showed evidence of patellar dislocation, MPFL disruption, contusions, VMO stripping.  Switch out of the immobilizer.  Continue PT and HEP.  Light duty while he continues to rehab this.  F/u in 4 weeks for reevaluation.

## 2014-06-02 NOTE — Assessment & Plan Note (Signed)
MRI showed evidence of patellar dislocation, MPFL disruption, contusions, VMO stripping.  Switch out of the immobilizer.  Continue PT and HEP.  Light duty while he continues to rehab this.  F/u in 4 weeks for reevaluation.

## 2014-06-02 NOTE — Therapy (Signed)
[facility] [place] [address]  [address] [place], [place], [address] Phone: [phone]   Fax:  [phone]  Physical Therapy Treatment  Patient Details  Name: Herbert Shelton MRN: [id number] Date of Birth: 03/27/1976 Referring Provider:  Lenda KelpHudnall, Shane R, MD  Encounter Date: 06/02/2014      PT End of Session - 06/02/14 0842    Visit Number 10   Number of Visits 12   Date for PT Re-Evaluation 06/06/14   PT Start Time 0808   PT Stop Time 0842   PT Time Calculation (min) 34 min   Activity Tolerance Patient tolerated treatment well   Behavior [facility] Therapy [facility] for tasks assessed/performed      No past medical history on file.  No past surgical history on file.  There were no vitals taken for this visit.  Visit Diagnosis:  Knee stiff, left  Left knee pain      Subjective Assessment - 06/02/14 0810    Symptoms MD said that he doesn't want him returning to work. Said to continue with PT and he will recheck in 3-4 weeks. Had some stiffness last night and early this morning. Pain is mainly at night. Feels 70-75% back to normal.           [facility] PT Assessment - 06/02/14 0001    ROM / Strength   AROM / PROM / Strength AROM   AROM   AROM Assessment Site Knee   Right/Left Knee Left   Left Knee Extension 0   Left Knee Flexion 128                  [facility] Adult PT Treatment/Exercise - 06/02/14 0001    Knee/Hip Exercises: Stretches   Knee: Self-Stretch to increase Flexion --  in quadruped rocking back onto heels x 10 reps with 5".   Knee/Hip Exercises: Aerobic   Stationary Bike level 1-2 x 6 minutes   Knee/Hip Exercises: Machines for Strengthening   Cybex Knee Extension Attempted 10# but pain   Cybex Knee Flexion SL 15# x10   Cybex Leg Press SL 25# 2x15   Knee/Hip Exercises: Plyometrics   Other Plyometric Exercises jumping jacks on floor x20, DL jumps on floor x 20 (slight pain on last rep)   Other Plyometric  Exercises BOSU lateral hop overs x 10   Knee/Hip Exercises: Standing   Heel Raises 15 reps  DL on UBE   Step Down ONGE;95eft;10 reps;Step Height: 8"   Wall Squat 3 seconds;10 reps   Other Standing Knee Exercises BOSU step ups 5" hold x 10 on hard side. Squats on hard side x 10. HHA on counter as needed.    Knee/Hip Exercises: Supine   Short Arc Quad Sets Left;10 reps  0#, 2#, 3#   Bridges 10 reps  3 second hold, ball between knees                  PT Short Term Goals - 05/22/14 0957    PT SHORT TERM GOAL #1   Title Pt. to report pain decrease so that tolerates sitting 20 minutes with pain 6/10 or less.  (05/16/14)   Time 3   Period Weeks   Status Achieved           PT Long Term Goals - 05/26/14 [id number]    PT LONG TERM GOAL #3   Title Pt to report pain decrease to no more than 4/10 with daily activities (06/06/14)  Time 6   Period Weeks   Status Achieved               Plan - 06/02/14 0844    Clinical Impression Statement Tolerated all exercises well with pain noted during knee extension and ony plyometric exercise.    Pt will benefit from skilled therapeutic intervention in order to improve on the following deficits Decreased range of motion;Pain;Decreased strength   Rehab Potential Good   PT Next Visit Plan Continue with strengtheining.    Consulted and Agree with Plan of Care Patient        Problem List Patient Active Problem List   Diagnosis Date Noted  . Left knee injury 04/09/2014    Ronney Lion, PTA 06/02/2014, 8:47 AM  [facility] [address]  [address] [place], [place], [address] Phone: [phone]   Fax:  [phone]

## 2014-06-05 ENCOUNTER — Ambulatory Visit: Payer: Managed Care, Other (non HMO) | Attending: Family Medicine

## 2014-06-05 DIAGNOSIS — M25562 Pain in left knee: Secondary | ICD-10-CM | POA: Diagnosis present

## 2014-06-05 DIAGNOSIS — M25662 Stiffness of left knee, not elsewhere classified: Secondary | ICD-10-CM | POA: Diagnosis not present

## 2014-06-05 NOTE — Therapy (Signed)
[facility] [place] [address]  [address] [place], [place], [address] Phone: [phone]   Fax:  [phone]  Physical Therapy Treatment/Renewal  Patient Details  Name: Herbert Shelton MRN: [id number] Date of Birth: 02/10/1976 Referring Provider:  Lenda KelpHudnall, Shane R, MD  Encounter Date: 06/05/2014      PT End of Session - 06/05/14 0840    Visit Number 11   Number of Visits 18   Date for PT Re-Evaluation 06/27/14   PT Start Time 0806   PT Stop Time 0842   PT Time Calculation (min) 36 min   Activity Tolerance Patient tolerated treatment well   Behavior During Therapy [facility] for tasks assessed/performed      No past medical history on file.  No past surgical history on file.  There were no vitals taken for this visit.  Visit Diagnosis:  Left knee pain - Plan: PT plan of care cert/re-cert  Knee stiff, left - Plan: PT plan of care cert/re-cert      Subjective Assessment - 06/05/14 0810    Symptoms Patient reports pain feels less than few seconds, but sharp and sometimes while driving or when first wake in the morning.   Pain Score 0-No pain  up to 6-7 at times          [facility] - 06/05/14 0001    ROM / Strength   AROM / PROM / Strength AROM;Strength   AROM   AROM Assessment Site Knee   Right/Left Knee Left   Left Knee Extension 0   Left Knee Flexion 135   Strength   Strength Assessment Site Knee;Hip   Right/Left Hip Left   Left Hip Flexion 5/5   Left Hip Extension 5/5   Left Hip ABduction 5/5   Right/Left Knee Left   Left Knee Flexion 4-/5  in prone   Left Knee Extension 5/5                  [facility] Adult PT Treatment/Exercise - 06/05/14 0001    Knee/Hip Exercises: Stretches   Quad Stretch 3 reps;20 seconds  with strap in prone   Knee: Self-Stretch to increase Flexion Other (comment)  in quadruped racking back onto heels 5 sec hold x 10    Knee/Hip Exercises: Aerobic   Stationary Bike  level 3 x 7 minutes   Knee/Hip Exercises: Machines for Strengthening   Cybex Knee Flexion SL 15# x10   Cybex Leg Press SL 25# 2x15   Knee/Hip Exercises: Plyometrics   Other Plyometric Exercises jumping jacks on floor x20, DL jumps on floor x 20    Other Plyometric Exercises BOSU lateral hop overs x 15   Knee/Hip Exercises: Standing   Side Lunges 15 reps;Left  onto BOSU   Knee/Hip Exercises: Supine   Short Arc Quad Sets Left;15 reps;Strengthening  5# x 2 sets   [employer] Strengthening;5 reps;Left  unilateral on left; terminated due to abdominal cramping                PT Education - 06/05/14 0840    Education provided Yes   Education Details POC extended x 3 weeks   Person(s) Educated Patient   Methods Explanation   Comprehension Verbalized understanding          PT Short Term Goals - 05/22/14 0957    PT SHORT TERM GOAL #1   Title Pt. to report pain decrease so that tolerates sitting 20 minutes with pain 6/10 or less.  (  05/16/14)   Time 3   Period Weeks   Status Achieved           PT Long Term Goals - 06/05/14 0844    PT LONG TERM GOAL #1   Title Pt. to demonstrate and/or verbalize techniques to reduce the risk of re-injury to include info on : anti-inflammatory (RICE method)  (06/06/14)   Status Achieved   PT LONG TERM GOAL #2   Title Pt. to be independent with advanced HEP for strength, balance and function.  (06/27/14)   Time 3   Period Weeks   Status On-going   PT LONG TERM GOAL #3   Title Pt to report pain decrease to no more than 4/10 with daily activities (06/27/14)   Time 3   Period Weeks   Status On-going   PT LONG TERM GOAL #4   Title Patient to increase ROM left knee flexion to at least 115 to allow reciprocal pattern on stairs.  (06/06/14)   Status Achieved   PT LONG TERM GOAL #5   Title Patient to tolerate sitting for 1 hour with pain no more than 4/10 (06/06/14)   Status Achieved   Additional Long Term Goals   Additional Long Term Goals Yes    PT LONG TERM GOAL #6   Title Patient to demo improved outcome on FOTO to no rmore than 44% limitation.  (06/27/14)   Time 3   Period Weeks   Status On-going   PT LONG TERM GOAL #7   Title Patient to tolerate full kneeling position for 10 minutes for work simulation.  (06/27/14)   Time 3   Period Weeks   Status New               Plan - 06/05/14 4098    Clinical Impression Statement Patient progressing with ROM and strength; still with hamstring weakness, occasional pain with daily activity and with full kneeling.  Pt reports MD wants him to continue PT till sees him 06/26/14 so will extend POC and continue work on plyometrics and work simulation.   Pt will benefit from skilled therapeutic intervention in order to improve on the following deficits Decreased strength;Pain;Impaired perceived functional ability   Rehab Potential Good   PT Frequency 2x / week   PT Duration 3 weeks   PT Treatment/Interventions ADLs/Self Care Home Management;Gait training;Stair training;Functional mobility training;Patient/family education;Passive range of motion;Therapeutic activities;Cryotherapy;Electrical Stimulation;Therapeutic exercise;Manual techniques;Balance training;Moist Heat   PT Next Visit Plan continue to progress activities for work simulation and plyometrics 3 more weeks   Consulted and Agree with Plan of Care Patient        Problem List Patient Active Problem List   Diagnosis Date Noted  . Left knee injury 04/09/2014    [facility] 06/05/2014, 8:48 AM  Sheran Lawless, PT  [facility] [address]  [address] [place], [place], [address] Phone: [phone]   Fax:  [phone]

## 2014-06-11 ENCOUNTER — Ambulatory Visit: Payer: Managed Care, Other (non HMO) | Admitting: Rehabilitation

## 2014-06-13 ENCOUNTER — Ambulatory Visit: Payer: Managed Care, Other (non HMO) | Admitting: Physical Therapy

## 2014-06-13 DIAGNOSIS — M25562 Pain in left knee: Secondary | ICD-10-CM | POA: Diagnosis not present

## 2014-06-13 DIAGNOSIS — M25662 Stiffness of left knee, not elsewhere classified: Secondary | ICD-10-CM

## 2014-06-13 NOTE — Therapy (Addendum)
[facility] [address]  [place], [place], [address] Phone: [phone]   Fax:  [phone]  Physical Therapy Treatment  Patient Details  Name: Herbert Shelton MRN: [id number] Date of Birth: 1975-06-29 Referring Provider:  Dene Gentry, MD  Encounter Date: 06/13/2014      PT End of Session - 06/13/14 0915    Visit Number 12   Number of Visits 18   Date for PT Re-Evaluation 06/27/14   PT Start Time 0845   PT Stop Time 0915  pt needed to leave early   PT Time Calculation (min) 30 min   Activity Tolerance Patient tolerated treatment well   Behavior During Therapy [facility] for tasks assessed/performed      No past medical history on file.  No past surgical history on file.  There were no vitals filed for this visit.  Visit Diagnosis:  Knee stiff, left  Left knee pain      Subjective Assessment - 06/13/14 0856    Symptoms Still a couple of twinges of pain: lasting a couple seconds to minutes then going away.                       [place] Adult PT Treatment/Exercise - 06/13/14 0856    Knee/Hip Exercises: Stretches   Knee: Self-Stretch to increase Flexion Other (comment)   Knee: Self-Stretch Limitations rocking back onto heels in quadruped 5sec x 10   Knee/Hip Exercises: Aerobic   Stationary Bike level 4 x 8 minutes   Knee/Hip Exercises: Machines for Strengthening   Total Gym Leg Press LLE 45# 3x10   Knee/Hip Exercises: Seated   Stool Scoot - Round Trips 3x90'   Other Seated Knee Exercises tall kneeling squats and ant/post weight shift 2x10 with blue medicine ball                  PT Short Term Goals - 05/22/14 0957    PT SHORT TERM GOAL #1   Title Pt. to report pain decrease so that tolerates sitting 20 minutes with pain 6/10 or less.  (05/16/14)   Time 3   Period Weeks   Status Achieved           PT Long Term Goals - 06/13/14 0916    PT LONG TERM GOAL #2   Title Pt.  to be independent with advanced HEP for strength, balance and function.  (06/27/14)   Status On-going   PT LONG TERM GOAL #3   Title Pt to report pain decrease to no more than 4/10 with daily activities (06/27/14)   Status On-going   PT LONG TERM GOAL #6   Title Patient to demo improved outcome on FOTO to no rmore than 44% limitation.  (06/27/14)   Status On-going   PT LONG TERM GOAL #7   Title Patient to tolerate full kneeling position for 10 minutes for work simulation.  (06/27/14)   Status On-going               Plan - 06/13/14 0915    Clinical Impression Statement Continuing to progress towards LTGs; able to perform simulated job activities with some increase in pain.  Pt needed to leave early today therefore session ended early.     PT Next Visit Plan continue to progress activities for work simulation and plyometrics    Consulted and Agree with Plan of Care Patient        Problem List Patient  Active Problem List   Diagnosis Date Noted  . Left knee injury 04/09/2014   Laureen Abrahams, PT, DPT 06/13/2014 9:17 AM  [facility] [place], [place], [address] Phone: [phone]   Fax:  [phone]       PHYSICAL THERAPY DISCHARGE SUMMARY  Visits from Start of Care: 12  Current functional level related to goals / functional outcomes: See above; unknown if goals met as pt did not return   Remaining deficits: unknown   Education / Equipment: HEP  Plan: Patient agrees to discharge.  Patient goals were not met. Patient is being discharged due to not returning since the last visit.  ?????   Pt no showed x 3 therefore d/c from PT services.   Laureen Abrahams, PT, DPT 07/08/2014 8:08 AM  [facility], [place] [address]  [phone] (office) [phone] (fax)

## 2014-06-16 ENCOUNTER — Ambulatory Visit: Payer: Managed Care, Other (non HMO) | Admitting: Rehabilitation

## 2014-06-20 ENCOUNTER — Encounter (HOSPITAL_BASED_OUTPATIENT_CLINIC_OR_DEPARTMENT_OTHER): Payer: Self-pay

## 2014-06-20 ENCOUNTER — Emergency Department (HOSPITAL_BASED_OUTPATIENT_CLINIC_OR_DEPARTMENT_OTHER)
Admission: EM | Admit: 2014-06-20 | Discharge: 2014-06-20 | Disposition: A | Discharge status: Home / Self Care | Payer: Managed Care, Other (non HMO) | Attending: Emergency Medicine | Admitting: Emergency Medicine

## 2014-06-20 ENCOUNTER — Ambulatory Visit: Payer: Managed Care, Other (non HMO)

## 2014-06-20 DIAGNOSIS — J069 Acute upper respiratory infection, unspecified: Secondary | ICD-10-CM | POA: Insufficient documentation

## 2014-06-20 DIAGNOSIS — R197 Diarrhea, unspecified: Secondary | ICD-10-CM | POA: Insufficient documentation

## 2014-06-20 DIAGNOSIS — Z720 Tobacco use: Secondary | ICD-10-CM | POA: Diagnosis not present

## 2014-06-20 DIAGNOSIS — M791 Myalgia: Secondary | ICD-10-CM | POA: Diagnosis not present

## 2014-06-20 DIAGNOSIS — R05 Cough: Secondary | ICD-10-CM | POA: Diagnosis present

## 2014-06-20 LAB — RAPID STREP SCREEN (MED CTR MEBANE ONLY): Streptococcus, Group A Screen (Direct): NEGATIVE

## 2014-06-20 MED ORDER — GUAIFENESIN-DM 100-10 MG/5ML PO SYRP: 5.0000 mL | ORAL_SOLUTION | Freq: Three times a day (TID) | ORAL | Status: DC | PRN

## 2014-06-20 NOTE — ED Notes (Signed)
MD at bedside. 

## 2014-06-20 NOTE — ED Provider Notes (Signed)
CSN: [id number]     Arrival date & time 06/20/14  0749 History   First MD Initiated Contact with Patient 06/20/14 [id number]     Chief Complaint  Patient presents with  . URI     (Consider location/radiation/quality/duration/timing/severity/associated sxs/prior Treatment) Patient is a 39 y.o. male presenting with URI. The history is provided by the patient.  URI Presenting symptoms: congestion, cough, fatigue and sore throat   Associated symptoms: myalgias   Associated symptoms: no sneezing    patient has had sore throat and chest pain for the last 2 days. He states his fatigue. He has had a cough with some sputum production. He is a smoker. Also head congestion. States he just feels bad. There is pain with swallowing. No sick contacts.  History reviewed. No pertinent past medical history. History reviewed. No pertinent past surgical history. No family history on file. History  Substance Use Topics  . Smoking status: Current Every Day Smoker    Types: Cigars, Cigarettes  . Smokeless tobacco: Never Used     Comment: 4-5 cigars per day  . Alcohol Use: 0.0 oz/week    0 Standard drinks or equivalent per week     Comment: occasional    Review of Systems  Constitutional: Positive for fatigue. Negative for diaphoresis and appetite change.  HENT: Positive for congestion, postnasal drip and sore throat. Negative for ear discharge, sneezing and trouble swallowing.   Eyes: Negative for pain.  Respiratory: Positive for cough. Negative for shortness of breath.   Cardiovascular: Positive for chest pain.  Gastrointestinal: Positive for diarrhea. Negative for nausea, vomiting and abdominal pain.  Musculoskeletal: Positive for myalgias.      Allergies  Review of patient's allergies indicates no known allergies.  Home Medications   Prior to Admission medications   Medication Sig Start Date End Date Taking? Authorizing Provider  guaiFENesin-dextromethorphan (ROBITUSSIN DM) 100-10 MG/5ML  syrup Take 5 mLs by mouth 3 (three) times daily as needed for cough. 06/20/14   Benjiman CoreNathan Brianna Bennett, MD  traMADol (ULTRAM) 50 MG tablet Take 1 tablet (50 mg total) by mouth every 6 (six) hours as needed. Patient not taking: Reported on 05/22/2014 05/16/14   Gilda Creasehristopher J Pollina, MD   BP 134/88 mmHg  Pulse 62  Temp(Src) 98.4 F (36.9 C) (Oral)  Resp 18  Ht 5\' 10"  (1.778 m)  Wt 170 lb (77.111 kg)  BMI 24.39 kg/m2  SpO2 100% Physical Exam  Constitutional: He appears well-developed and well-nourished.  HENT:  Head: Atraumatic.  Tonsillar swelling on right side without  Eyes: Pupils are equal, round, and reactive to light.  Cardiovascular: Normal rate and regular rhythm.   Pulmonary/Chest: Effort normal and breath sounds normal.  Abdominal: There is no tenderness.  Musculoskeletal: Normal range of motion.  Lymphadenopathy:    He has cervical adenopathy.  Neurological: He is alert.  Skin: Skin is warm.    ED Course  Procedures (including critical care time) Labs Review Labs Reviewed  RAPID STREP SCREEN    Imaging Review No results found.   EKG Interpretation None      MDM   Final diagnoses:  URI (upper respiratory infection)    Patient with URI symptoms. Lungs are clear. Doubt pneumonia. Negative strep. Will discharge home    Benjiman CoreNathan Othman Masur, MD 06/20/14 [phone]

## 2014-06-20 NOTE — Discharge Instructions (Signed)
Upper Respiratory Infection, Adult An upper respiratory infection (URI) is also sometimes known as the common cold. The upper respiratory tract includes the nose, sinuses, throat, trachea, and bronchi. Bronchi are the airways leading to the lungs. Most people improve within 1 week, but symptoms can last up to 2 weeks. A residual cough may last even longer.  CAUSES Many different viruses can infect the tissues lining the upper respiratory tract. The tissues become irritated and inflamed and often become very moist. Mucus production is also common. A cold is contagious. You can easily spread the virus to others by oral contact. This includes kissing, sharing a glass, coughing, or sneezing. Touching your mouth or nose and then touching a surface, which is then touched by another person, can also spread the virus. SYMPTOMS  Symptoms typically develop 1 to 3 days after you come in contact with a cold virus. Symptoms vary from person to person. They may include:  Runny nose.  Sneezing.  Nasal congestion.  Sinus irritation.  Sore throat.  Loss of voice (laryngitis).  Cough.  Fatigue.  Muscle aches.  Loss of appetite.  Headache.  Low-grade fever. DIAGNOSIS  You might diagnose your own cold based on familiar symptoms, since most people get a cold 2 to 3 times a year. Your caregiver can confirm this based on your exam. Most importantly, your caregiver can check that your symptoms are not due to another disease such as strep throat, sinusitis, pneumonia, asthma, or epiglottitis. Blood tests, throat tests, and X-rays are not necessary to diagnose a common cold, but they may sometimes be helpful in excluding other more serious diseases. Your caregiver will decide if any further tests are required. RISKS AND COMPLICATIONS  You may be at risk for a more severe case of the common cold if you smoke cigarettes, have chronic heart disease (such as heart failure) or lung disease (such as asthma), or if  you have a weakened immune system. The very young and very old are also at risk for more serious infections. Bacterial sinusitis, middle ear infections, and bacterial pneumonia can complicate the common cold. The common cold can worsen asthma and chronic obstructive pulmonary disease (COPD). Sometimes, these complications can require emergency medical care and may be life-threatening. PREVENTION  The best way to protect against getting a cold is to practice good hygiene. Avoid oral or hand contact with people with cold symptoms. Wash your hands often if contact occurs. There is no clear evidence that vitamin C, vitamin E, echinacea, or exercise reduces the chance of developing a cold. However, it is always recommended to get plenty of rest and practice good nutrition. TREATMENT  Treatment is directed at relieving symptoms. There is no cure. Antibiotics are not effective, because the infection is caused by a virus, not by bacteria. Treatment may include:  Increased fluid intake. Sports drinks offer valuable electrolytes, sugars, and fluids.  Breathing heated mist or steam (vaporizer or shower).  Eating chicken soup or other clear broths, and maintaining good nutrition.  Getting plenty of rest.  Using gargles or lozenges for comfort.  Controlling fevers with ibuprofen or acetaminophen as directed by your caregiver.  Increasing usage of your inhaler if you have asthma. Zinc gel and zinc lozenges, taken in the first 24 hours of the common cold, can shorten the duration and lessen the severity of symptoms. Pain medicines may help with fever, muscle aches, and throat pain. A variety of non-prescription medicines are available to treat congestion and runny nose. Your caregiver   can make recommendations and may suggest nasal or lung inhalers for other symptoms.  HOME CARE INSTRUCTIONS   Only take over-the-counter or prescription medicines for pain, discomfort, or fever as directed by your  caregiver.  Use a warm mist humidifier or inhale steam from a shower to increase air moisture. This may keep secretions moist and make it easier to breathe.  Drink enough water and fluids to keep your urine clear or pale yellow.  Rest as needed.  Return to work when your temperature has returned to normal or as your caregiver advises. You may need to stay home longer to avoid infecting others. You can also use a face mask and careful hand washing to prevent spread of the virus. SEEK MEDICAL CARE IF:   After the first few days, you feel you are getting worse rather than better.  You need your caregiver's advice about medicines to control symptoms.  You develop chills, worsening shortness of breath, or brown or red sputum. These may be signs of pneumonia.  You develop yellow or brown nasal discharge or pain in the face, especially when you bend forward. These may be signs of sinusitis.  You develop a fever, swollen neck glands, pain with swallowing, or white areas in the back of your throat. These may be signs of strep throat. SEEK IMMEDIATE MEDICAL CARE IF:   You have a fever.  You develop severe or persistent headache, ear pain, sinus pain, or chest pain.  You develop wheezing, a prolonged cough, cough up blood, or have a change in your usual mucus (if you have chronic lung disease).  You develop sore muscles or a stiff neck. Document Released: 09/14/2000 Document Revised: 06/13/2011 Document Reviewed: 06/26/2013 ExitCare Patient Information 2015 [employer]. This information is not intended to replace advice given to you by your health care provider. Make sure you discuss any questions you have with your health care provider.  

## 2014-06-20 NOTE — ED Notes (Signed)
Pt reports congestion, runny nose and scratchy throat x 2 days. No fever.

## 2014-06-22 LAB — CULTURE, GROUP A STREP: STREP A CULTURE: NEGATIVE

## 2014-06-23 ENCOUNTER — Ambulatory Visit: Payer: Managed Care, Other (non HMO)

## 2014-06-25 ENCOUNTER — Encounter (INDEPENDENT_AMBULATORY_CARE_PROVIDER_SITE_OTHER): Payer: Self-pay

## 2014-06-25 ENCOUNTER — Encounter: Payer: Self-pay | Admitting: Family Medicine

## 2014-06-25 ENCOUNTER — Ambulatory Visit (INDEPENDENT_AMBULATORY_CARE_PROVIDER_SITE_OTHER): Payer: Managed Care, Other (non HMO) | Admitting: Family Medicine

## 2014-06-25 VITALS — BP 134/79 | HR 101 | Ht 70.0 in | Wt 170.0 lb

## 2014-06-25 DIAGNOSIS — S8992XD Unspecified injury of left lower leg, subsequent encounter: Secondary | ICD-10-CM

## 2014-06-26 ENCOUNTER — Ambulatory Visit: Payer: Managed Care, Other (non HMO) | Admitting: Rehabilitation

## 2014-06-26 ENCOUNTER — Ambulatory Visit: Payer: Managed Care, Other (non HMO) | Admitting: Family Medicine

## 2014-06-30 ENCOUNTER — Ambulatory Visit: Payer: Managed Care, Other (non HMO) | Admitting: Rehabilitation

## 2014-06-30 NOTE — Assessment & Plan Note (Signed)
MRI showed evidence of patellar dislocation, MPFL disruption, contusions, VMO stripping.  Doing extremely well with physical therapy, home exercises and now without pain or feeling of instability.  Switch to HEP only, f/u prn.

## 2014-06-30 NOTE — Progress Notes (Signed)
PCP: No primary care provider on file.  Subjective:   HPI: Patient is a 39 y.o. male here for left knee injury.  1/5: Patient reports on 12/24 he was horsing around with some friends when he believes he twisted his left knee. Felt it give out. Lots of swelling. Went to ED and had x-rays that were negative. Placed in immobilizer with crutches and referred here. Been taking ibuprofen and oxycodone. No prior injuries.  2/25: Patient reports his knee feels about 75% improved. Some stiffness. Doing PT and HEP which have helped. Pain worse in morning and with cold weather. Slight swelling.  3/23: Patient reports he's doing extremely well. Done well with physical therapy and doing home exercises. No pain or feeling of kneecap giving out.  No past medical history on file.  Current Outpatient Prescriptions on File Prior to Visit  Medication Sig Dispense Refill  . traMADol (ULTRAM) 50 MG tablet Take 1 tablet (50 mg total) by mouth every 6 (six) hours as needed. (Patient not taking: Reported on 05/22/2014) 15 tablet 0   No current facility-administered medications on file prior to visit.    No past surgical history on file.  No Known Allergies  History   Social History  . Marital Status: Married    Spouse Name: N/A  . Number of Children: N/A  . Years of Education: N/A   Occupational History  . Not on file.   Social History Main Topics  . Smoking status: Current Every Day Smoker    Types: Cigars, Cigarettes  . Smokeless tobacco: Never Used     Comment: 4-5 cigars per day  . Alcohol Use: 0.0 oz/week    0 Standard drinks or equivalent per week     Comment: occasional  . Drug Use: Yes    Special: Marijuana  . Sexual Activity: Not on file   Other Topics Concern  . Not on file   Social History Narrative    No family history on file.  BP 134/79 mmHg  Pulse 101  Ht 5\' 10"  (1.778 m)  Wt 170 lb (77.111 kg)  BMI 24.39 kg/m2  Review of Systems: See HPI above.   Objective:  Physical Exam:  Gen: NAD  Left knee: No swelling, bruising, other deformity. No TTP medial joint line and suprapatellar pouch.  No lateral, other tenderness. FROM Negative ant drawer, post drawer.  1+ lachmanns. No pain with valgus/varus testing Negative mcmurrays and apleys.  Negative patellar apprehension.   NV intact distally.    Assessment & Plan:  1. Left knee injury - MRI showed evidence of patellar dislocation, MPFL disruption, contusions, VMO stripping.  Doing extremely well with physical therapy, home exercises and now without pain or feeling of instability.  Switch to HEP only, f/u prn.

## 2014-07-04 ENCOUNTER — Ambulatory Visit: Payer: Managed Care, Other (non HMO) | Attending: Family Medicine | Admitting: Rehabilitation

## 2014-07-04 DIAGNOSIS — M25562 Pain in left knee: Secondary | ICD-10-CM | POA: Insufficient documentation

## 2014-07-04 DIAGNOSIS — M25662 Stiffness of left knee, not elsewhere classified: Secondary | ICD-10-CM | POA: Insufficient documentation

## 2014-08-11 ENCOUNTER — Emergency Department (HOSPITAL_BASED_OUTPATIENT_CLINIC_OR_DEPARTMENT_OTHER): Payer: Managed Care, Other (non HMO)

## 2014-08-11 ENCOUNTER — Encounter (HOSPITAL_BASED_OUTPATIENT_CLINIC_OR_DEPARTMENT_OTHER): Payer: Self-pay

## 2014-08-11 ENCOUNTER — Emergency Department (HOSPITAL_BASED_OUTPATIENT_CLINIC_OR_DEPARTMENT_OTHER)
Admission: EM | Admit: 2014-08-11 | Discharge: 2014-08-11 | Disposition: A | Discharge status: Home / Self Care | Payer: Managed Care, Other (non HMO) | Attending: Emergency Medicine | Admitting: Emergency Medicine

## 2014-08-11 DIAGNOSIS — M62838 Other muscle spasm: Secondary | ICD-10-CM | POA: Insufficient documentation

## 2014-08-11 DIAGNOSIS — M25512 Pain in left shoulder: Secondary | ICD-10-CM | POA: Diagnosis present

## 2014-08-11 DIAGNOSIS — M5412 Radiculopathy, cervical region: Secondary | ICD-10-CM | POA: Insufficient documentation

## 2014-08-11 DIAGNOSIS — Z720 Tobacco use: Secondary | ICD-10-CM | POA: Insufficient documentation

## 2014-08-11 MED ORDER — MELOXICAM 7.5 MG PO TABS: 7.5000 mg | ORAL_TABLET | Freq: Every day | ORAL | Status: DC

## 2014-08-11 MED ORDER — TRAMADOL HCL 50 MG PO TABS: 50.0000 mg | ORAL_TABLET | Freq: Four times a day (QID) | ORAL | Status: DC | PRN

## 2014-08-11 MED ORDER — METHOCARBAMOL 500 MG PO TABS: 500.0000 mg | ORAL_TABLET | Freq: Two times a day (BID) | ORAL | Status: DC

## 2014-08-11 NOTE — ED Provider Notes (Signed)
CSN: [id number]     Arrival date & time 08/11/14  1635 History   First MD Initiated Contact with Patient 08/11/14 1652     Chief Complaint  Patient presents with  . Shoulder Pain     (Consider location/radiation/quality/duration/timing/severity/associated sxs/prior Treatment) HPI Pt is a 39yo male presenting to ED with c/o gradually worsening Left shoulder pain that started about 1 week ago. Pain radiates down Left arm. Pt states he woke up with the pain. 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  Denies previous injury or surgery to Left shoulder. Pain is constant, aching, throbbing, 10/10 at worst, better with rest. Minimal relief with 800mg  ibuprofen and OxyContin. Denies neck or back pain. No other significant PMH.   History reviewed. No pertinent past medical history. History reviewed. No pertinent past surgical history. No family history on file. History  Substance Use Topics  . Smoking status: Current Every Day Smoker    Types: Cigars, Cigarettes  . Smokeless tobacco: Never Used     Comment: 4-5 cigars per day  . Alcohol Use: 0.0 oz/week    0 Standard drinks or equivalent per week     Comment: occasional    Review of Systems  Constitutional: Negative for fever and chills.  Musculoskeletal: Positive for myalgias and arthralgias. Negative for back pain, joint swelling, gait problem, neck pain and neck stiffness.  Skin: Negative for color change, rash and wound.  Neurological: Negative for tremors, weakness and numbness.  All other systems reviewed and are negative.     Allergies  Review of patient's allergies indicates no known allergies.  Home Medications   Prior to Admission medications   Medication Sig Start Date End Date Taking? Authorizing Provider  meloxicam (MOBIC) 7.5 MG tablet Take 1 tablet (7.5 mg total) by mouth daily. 08/11/14   Junius FinnerErin O'Malley, PA-C  methocarbamol (ROBAXIN) 500 MG tablet Take 1 tablet (500 mg total) by mouth 2 (two) times  daily. 08/11/14   Junius FinnerErin O'Malley, PA-C  traMADol (ULTRAM) 50 MG tablet Take 1 tablet (50 mg total) by mouth every 6 (six) hours as needed. Patient not taking: Reported on 05/22/2014 05/16/14   Gilda Creasehristopher J Pollina, MD  traMADol (ULTRAM) 50 MG tablet Take 1 tablet (50 mg total) by mouth every 6 (six) hours as needed. 08/11/14   Junius FinnerErin O'Malley, PA-C   BP 115/68 mmHg  Pulse 98  Temp(Src) 98.1 F (36.7 C) (Oral)  Resp 14  Ht 5\' 9"  (1.753 m)  Wt 175 lb (79.379 kg)  BMI 25.83 kg/m2  SpO2 99% Physical Exam  Constitutional: He is oriented to person, place, and time. He appears well-developed and well-nourished.  HENT:  Head: Normocephalic and atraumatic.  Eyes: EOM are normal.  Neck: Normal range of motion. Neck supple.  Cardiovascular: Normal rate.   Pulses:      Radial pulses are 2+ on the right side, and 2+ on the left side.  Pulmonary/Chest: Effort normal.  Musculoskeletal: Normal range of motion. He exhibits tenderness. He exhibits no edema.  No midline spinal tenderness.  Tenderness to Left side of neck and Left upper trapezius with palpable muscle spasm.  No bony tenderness. FROM left shoulder with 5/5 strength.   Neurological: He is alert and oriented to person, place, and time.  Left arm: sensation in tact, symmetric compared to Right arm  Skin: Skin is warm and dry. No rash noted. No erythema.  Psychiatric: He has a normal mood and affect. His behavior is normal.  Nursing  note and vitals reviewed.   ED Course  Procedures (including critical care time) Labs Review Labs Reviewed - No data to display  Imaging Review No results found.   EKG Interpretation None      MDM   Final diagnoses:  Left shoulder pain  Cervical radiculopathy  Muscle spasm   Pt is a 39yo male, works as a Psychologist, occupationalwelder, c/o gradually worsening Left shoulder pain. Pt has a palpable muscle spasm of Left upper trapezius w/o midline spinal tenderness or bony tenderness of shoulder. Do not believe imaging would be  of benefit at this time as no recent hx of trauma. Will tx conservatively Rx: mobic, tramadol and robaxin. Home care instructions provided. Advised to f/u with Dr. Pearletha ForgeHudnall, Sports Medicine, next week if symptoms not improving. Pt verbalized understanding and agreement with tx plan.    Junius Finnerrin O'Malley, PA-C 08/11/14 1816  Jerelyn ScottMartha Linker, MD 08/11/14 [phone]

## 2014-08-11 NOTE — ED Notes (Signed)
C/o left shoulder pain, radiating down left arm x 1 week. Denies injury

## 2015-08-20 ENCOUNTER — Emergency Department (HOSPITAL_BASED_OUTPATIENT_CLINIC_OR_DEPARTMENT_OTHER): Payer: Managed Care, Other (non HMO)

## 2015-08-20 ENCOUNTER — Emergency Department (HOSPITAL_BASED_OUTPATIENT_CLINIC_OR_DEPARTMENT_OTHER)
Admission: EM | Admit: 2015-08-20 | Discharge: 2015-08-20 | Disposition: A | Discharge status: Home / Self Care | Payer: Managed Care, Other (non HMO) | Attending: Emergency Medicine | Admitting: Emergency Medicine

## 2015-08-20 ENCOUNTER — Encounter (HOSPITAL_BASED_OUTPATIENT_CLINIC_OR_DEPARTMENT_OTHER): Payer: Self-pay | Admitting: *Deleted

## 2015-08-20 DIAGNOSIS — J189 Pneumonia, unspecified organism: Secondary | ICD-10-CM | POA: Insufficient documentation

## 2015-08-20 DIAGNOSIS — J0110 Acute frontal sinusitis, unspecified: Secondary | ICD-10-CM | POA: Diagnosis not present

## 2015-08-20 DIAGNOSIS — H579 Unspecified disorder of eye and adnexa: Secondary | ICD-10-CM | POA: Insufficient documentation

## 2015-08-20 DIAGNOSIS — F17210 Nicotine dependence, cigarettes, uncomplicated: Secondary | ICD-10-CM | POA: Diagnosis not present

## 2015-08-20 DIAGNOSIS — R509 Fever, unspecified: Secondary | ICD-10-CM | POA: Diagnosis present

## 2015-08-20 DIAGNOSIS — R200 Anesthesia of skin: Secondary | ICD-10-CM | POA: Insufficient documentation

## 2015-08-20 LAB — CBC WITH DIFFERENTIAL/PLATELET
BASOS PCT: 0 %
Basophils Absolute: 0 10*3/uL (ref 0.0–0.1)
EOS PCT: 1 %
Eosinophils Absolute: 0.2 10*3/uL (ref 0.0–0.7)
HCT: 44.1 % (ref 39.0–52.0)
Hemoglobin: 15.1 g/dL (ref 13.0–17.0)
Lymphocytes Relative: 9 %
Lymphs Abs: 1.7 10*3/uL (ref 0.7–4.0)
MCH: 31.6 pg (ref 26.0–34.0)
MCHC: 34.2 g/dL (ref 30.0–36.0)
MCV: 92.3 fL (ref 78.0–100.0)
MONO ABS: 1 10*3/uL (ref 0.1–1.0)
Monocytes Relative: 5 %
Neutro Abs: 16.7 10*3/uL — ABNORMAL HIGH (ref 1.7–7.7)
Neutrophils Relative %: 85 %
Platelets: 231 10*3/uL (ref 150–400)
RBC: 4.78 MIL/uL (ref 4.22–5.81)
RDW: 12.8 % (ref 11.5–15.5)
WBC: 19.6 10*3/uL — ABNORMAL HIGH (ref 4.0–10.5)

## 2015-08-20 LAB — COMPREHENSIVE METABOLIC PANEL
ALK PHOS: 67 U/L (ref 38–126)
ALT: 17 U/L (ref 17–63)
AST: 18 U/L (ref 15–41)
Albumin: 3.5 g/dL (ref 3.5–5.0)
Anion gap: 7 (ref 5–15)
BUN: 9 mg/dL (ref 6–20)
CALCIUM: 8.6 mg/dL — AB (ref 8.9–10.3)
CO2: 27 mmol/L (ref 22–32)
CREATININE: 0.97 mg/dL (ref 0.61–1.24)
Chloride: 98 mmol/L — ABNORMAL LOW (ref 101–111)
GFR calc non Af Amer: >60 mL/min (ref >60)
Glucose, Bld: 107 mg/dL — ABNORMAL HIGH (ref 65–99)
Potassium: 3.7 mmol/L (ref 3.5–5.1)
SODIUM: 132 mmol/L — AB (ref 135–145)
Total Bilirubin: 0.7 mg/dL (ref 0.3–1.2)
Total Protein: 6.6 g/dL (ref 6.5–8.1)

## 2015-08-20 LAB — I-STAT CG4 LACTIC ACID, ED: Lactic Acid, Venous: 1.03 mmol/L (ref 0.5–2.0)

## 2015-08-20 MED ORDER — LORATADINE 10 MG PO TABS: 10.0000 mg | ORAL_TABLET | Freq: Every day | ORAL | Status: DC

## 2015-08-20 MED ORDER — ACETAMINOPHEN 500 MG PO TABS
1000.0000 mg | ORAL_TABLET | Freq: Once | ORAL | Status: AC
  Administered 2015-08-20: 1000 mg via ORAL
  Filled 2015-08-20: qty 2

## 2015-08-20 MED ORDER — LEVOFLOXACIN 750 MG PO TABS
750.0000 mg | ORAL_TABLET | Freq: Once | ORAL | Status: AC
  Administered 2015-08-20: 750 mg via ORAL
  Filled 2015-08-20: qty 1

## 2015-08-20 MED ORDER — KETOROLAC TROMETHAMINE 30 MG/ML IJ SOLN
30.0000 mg | Freq: Once | INTRAMUSCULAR | Status: DC
  Filled 2015-08-20: qty 1

## 2015-08-20 MED ORDER — BENZONATATE 100 MG PO CAPS: 100.0000 mg | ORAL_CAPSULE | Freq: Three times a day (TID) | ORAL | Status: AC | PRN

## 2015-08-20 MED ORDER — KETOROLAC TROMETHAMINE 30 MG/ML IJ SOLN
30.0000 mg | Freq: Once | INTRAMUSCULAR | Status: AC
  Administered 2015-08-20: 30 mg via INTRAMUSCULAR

## 2015-08-20 MED ORDER — IBUPROFEN 600 MG PO TABS: 600.0000 mg | ORAL_TABLET | Freq: Four times a day (QID) | ORAL | Status: AC | PRN

## 2015-08-20 MED ORDER — LEVOFLOXACIN 750 MG PO TABS: 750.0000 mg | ORAL_TABLET | Freq: Every day | ORAL | Status: DC

## 2015-08-20 MED ORDER — METHOCARBAMOL 500 MG PO TABS: 1000.0000 mg | ORAL_TABLET | Freq: Three times a day (TID) | ORAL | Status: AC | PRN

## 2015-08-20 MED FILL — BENZONATATE 100 MG CAPSULE: 100 | 7 days supply | Qty: 21 | Fill #0

## 2015-08-20 MED FILL — METHOCARBAMOL 500 MG TABLET: 500 | 5 days supply | Qty: 30 | Fill #0

## 2015-08-20 MED FILL — LORATADINE 10 MG TABLET: 10 | 100 days supply | Qty: 100 | Fill #0

## 2015-08-20 MED FILL — IBUPROFEN 600 MG TABLET: 600 | 8 days supply | Qty: 30 | Fill #0

## 2015-08-20 MED FILL — levoFLOXacin 750 MG TABS: 750 | 6 days supply | Qty: 6 | Fill #0

## 2015-08-20 NOTE — ED Notes (Signed)
Fever, chills, and body aches since yesterday.

## 2015-08-20 NOTE — ED Notes (Signed)
Pt falling asleep during nurse's assessment.

## 2015-08-20 NOTE — ED Notes (Signed)
Pt made aware to return if symptoms worsen or if any life threatening symptoms occur.   

## 2015-08-20 NOTE — ED Notes (Signed)
Pt taken to XR.  

## 2015-08-20 NOTE — ED Provider Notes (Signed)
CSN: [id number]     Arrival date & time 08/20/15  1139 History   First MD Initiated Contact with Patient 08/20/15 1206     Chief Complaint  Patient presents with  . Fever     (Consider location/radiation/quality/duration/timing/severity/associated sxs/prior Treatment) HPI Patient presents with 2 days of fever, shaking chills, diffuse body aches, cough productive of brown mucus. He also has nasal congestion with sinus pressure especially on the left. Denies any neck pain or stiffness. No abdominal pain, nausea, vomiting or diarrhea. No known sick contacts. No new rashes. History reviewed. No pertinent past medical history. History reviewed. No pertinent past surgical history. No family history on file. Social History  Substance Use Topics  . Smoking status: Current Every Day Smoker    Types: Cigars, Cigarettes  . Smokeless tobacco: Never Used     Comment: 4-5 cigars per day  . Alcohol Use: 0.0 oz/week    0 Standard drinks or equivalent per week     Comment: occasional    Review of Systems  Constitutional: Positive for fever, chills, diaphoresis and fatigue.  HENT: Positive for congestion and sinus pressure. Negative for ear pain, facial swelling and sore throat.   Respiratory: Positive for cough. Negative for chest tightness and shortness of breath.   Cardiovascular: Negative for chest pain, palpitations and leg swelling.  Gastrointestinal: Negative for nausea, vomiting, abdominal pain, diarrhea and constipation.  Musculoskeletal: Positive for myalgias. Negative for back pain, neck pain and neck stiffness.  Skin: Negative for rash and wound.  Neurological: Positive for numbness and headaches. Negative for dizziness, weakness and light-headedness.  All other systems reviewed and are negative.     Allergies  Review of patient's allergies indicates no known allergies.  Home Medications   Prior to Admission medications   Medication Sig Start Date End Date Taking? Authorizing  Provider  benzonatate (TESSALON) 100 MG capsule Take 1 capsule (100 mg total) by mouth 3 (three) times daily as needed for cough. 08/20/15   Loren Racer, MD  ibuprofen (ADVIL,MOTRIN) 600 MG tablet Take 1 tablet (600 mg total) by mouth every 6 (six) hours as needed. 08/20/15   Loren Racer, MD  levofloxacin (LEVAQUIN) 750 MG tablet Take 1 tablet (750 mg total) by mouth daily. X 6 days 08/21/15   Loren Racer, MD  loratadine (CLARITIN) 10 MG tablet Take 1 tablet (10 mg total) by mouth daily. 08/20/15   Loren Racer, MD  meloxicam (MOBIC) 7.5 MG tablet Take 1 tablet (7.5 mg total) by mouth daily. 08/11/14   Junius Finner, PA-C  methocarbamol (ROBAXIN) 500 MG tablet Take 2 tablets (1,000 mg total) by mouth every 8 (eight) hours as needed for muscle spasms. 08/20/15   Loren Racer, MD  traMADol (ULTRAM) 50 MG tablet Take 1 tablet (50 mg total) by mouth every 6 (six) hours as needed. Patient not taking: Reported on 05/22/2014 05/16/14   Gilda Crease, MD  traMADol (ULTRAM) 50 MG tablet Take 1 tablet (50 mg total) by mouth every 6 (six) hours as needed. 08/11/14   Junius Finner, PA-C   BP 125/84 mmHg  Pulse 102  Temp(Src) 101.5 F (38.6 C) (Oral)  Resp 20  Ht  (1.778 m)  Wt 175 lb (79.379 kg)  BMI 25.11 kg/m2  SpO2 99% Physical Exam  Constitutional: He is oriented to person, place, and time. He appears well-developed and well-nourished. No distress.  HENT:  Head: Normocephalic and atraumatic.  Mouth/Throat: Oropharynx is clear and moist. No oropharyngeal exudate.  Bilateral nasal  mucosal edema. Patient has tender to percussion over the left frontal and maxillary sinuses.   Eyes: EOM are normal. Pupils are equal, round, and reactive to light. Right eye exhibits no discharge. Left eye exhibits discharge.  Injection to the left sclera. Clear discharge. Photophobia  Neck: Normal range of motion. Neck supple.  No meningismus  Cardiovascular: Normal rate and regular rhythm.  Exam  reveals no gallop and no friction rub.   No murmur heard. Pulmonary/Chest: Effort normal. No respiratory distress. He has no wheezes. He has rales.  Few scattered rales  Abdominal: Soft. Bowel sounds are normal. He exhibits no distension and no mass. There is no tenderness. There is no rebound and no guarding.  Musculoskeletal: Normal range of motion. He exhibits no edema or tenderness.  Diffuse tenderness to bilateral lower extremities. No redness, swelling or asymmetry. Distal pulses are equal and intact.  Lymphadenopathy:    He has no cervical adenopathy.  Neurological: He is alert and oriented to person, place, and time.  5/5 motor in all extremities. Sensation is fully intact.  Skin: Skin is warm and dry. No rash noted. No erythema.  Psychiatric: He has a normal mood and affect. His behavior is normal.  Nursing note and vitals reviewed.   ED Course  Procedures (including critical care time) Labs Review Labs Reviewed  CBC WITH DIFFERENTIAL/PLATELET - Abnormal; Notable for the following:    WBC 19.6 (*)    Neutro Abs 16.7 (*)    All other components within normal limits  COMPREHENSIVE METABOLIC PANEL - Abnormal; Notable for the following:    Sodium 132 (*)    Chloride 98 (*)    Glucose, Bld 107 (*)    Calcium 8.6 (*)    All other components within normal limits  I-STAT CG4 LACTIC ACID, ED    Imaging Review Dg Chest 2 View  08/20/2015  CLINICAL DATA:  Fever, chills, cough, and body aches for the past 2 days, daily smoker. EXAM: CHEST  2 VIEW COMPARISON:  None in PACs FINDINGS: The lungs are adequately inflated. The interstitial markings are coarse especially on the right. There is confluent increased density in the right lower lobe. The heart and pulmonary vascularity are normal. The mediastinum is normal in width. There is no pleural effusion. IMPRESSION: Atelectasis or pneumonia in the right lower lobe posteriorly. Electronically Signed   By: Everett Ehrler  [place] M.D.   On: 08/20/2015  13:20   I have personally reviewed and evaluated these images and lab results as part of my medical decision-making.   EKG Interpretation None      MDM   Final diagnoses:  CAP (community acquired pneumonia)  Acute frontal sinusitis, recurrence not specified   Patient with evidence of pneumonia on chest x-ray. Given first dose of Levaquin in the emergency department. We will give week supply of antibiotics and also treat sinusitis. Advised to return immediately for any worsening symptoms or for any concerns.    Loren Raceravid Melody Cirrincione, MD 08/20/15 [id number]

## 2015-08-20 NOTE — Discharge Instructions (Signed)
Community-Acquired Pneumonia, Adult °Pneumonia is an infection of the lungs. There are different types of pneumonia. One type can develop while a person is in a hospital. A different type, called community-acquired pneumonia, develops in people who are not, or have not recently been, in the hospital or other health care facility.  °CAUSES °Pneumonia may be caused by bacteria, viruses, or funguses. Community-acquired pneumonia is often caused by Streptococcus pneumonia bacteria. These bacteria are often passed from one person to another by breathing in droplets from the cough or sneeze of an infected person. °RISK FACTORS °The condition is more likely to develop in: °· People who have chronic diseases, such as chronic obstructive pulmonary disease (COPD), asthma, congestive heart failure, cystic fibrosis, diabetes, or kidney disease. °· People who have early-stage or late-stage HIV. °· People who have sickle cell disease. °· People who have had their spleen removed (splenectomy). °· People who have poor dental hygiene. °· People who have medical conditions that increase the risk of breathing in (aspirating) secretions their own mouth and nose.   °· People who have a weakened immune system (immunocompromised). °· People who smoke. °· People who travel to areas where pneumonia-causing germs commonly exist. °· People who are around animal habitats or animals that have pneumonia-causing germs, including birds, bats, rabbits, cats, and farm animals. °SYMPTOMS °Symptoms of this condition include: °· A dry cough. °· A wet (productive) cough. °· Fever. °· Sweating. °· Chest pain, especially when breathing deeply or coughing. °· Rapid breathing or difficulty breathing. °· Shortness of breath. °· Shaking chills. °· Fatigue. °· Muscle aches. °DIAGNOSIS °Your health care provider will take a medical history and perform a physical exam. You may also have other tests, including: °· Imaging studies of your chest, including  X-rays. °· Tests to check your blood oxygen level and other blood gases. °· Other tests on blood, mucus (sputum), fluid around your lungs (pleural fluid), and urine. °If your pneumonia is severe, other tests may be done to identify the specific cause of your illness. °TREATMENT °The type of treatment that you receive depends on many factors, such as the cause of your pneumonia, the medicines you take, and other medical conditions that you have. For most adults, treatment and recovery from pneumonia may occur at home. In some cases, treatment must happen in a hospital. Treatment may include: °· Antibiotic medicines, if the pneumonia was caused by bacteria. °· Antiviral medicines, if the pneumonia was caused by a virus. °· Medicines that are given by mouth or through an IV tube. °· Oxygen. °· Respiratory therapy. °Although rare, treating severe pneumonia may include: °· Mechanical ventilation. This is done if you are not breathing well on your own and you cannot maintain a safe blood oxygen level. °· Thoracentesis. This procedure removes fluid around one lung or both lungs to help you breathe better. °HOME CARE INSTRUCTIONS °· Take over-the-counter and prescription medicines only as told by your health care provider. °· Only take cough medicine if you are losing sleep. Understand that cough medicine can prevent your body's natural ability to remove mucus from your lungs. °· If you were prescribed an antibiotic medicine, take it as told by your health care provider. Do not stop taking the antibiotic even if you start to feel better. °· Sleep in a semi-upright position at night. Try sleeping in a reclining chair, or place a few pillows under your head. °· Do not use tobacco products, including cigarettes, chewing tobacco, and e-cigarettes. If you need help quitting, ask your health care provider. °· Drink enough water to keep your urine   clear or pale yellow. This will help to thin out mucus secretions in your  lungs. °PREVENTION °There are ways that you can decrease your risk of developing community-acquired pneumonia. Consider getting a pneumococcal vaccine if: °· You are older than 39 years of age. °· You are older than 39 years of age and are undergoing cancer treatment, have chronic lung disease, or have other medical conditions that affect your immune system. Ask your health care provider if this applies to you. °There are different types and schedules of pneumococcal vaccines. Ask your health care provider which vaccination option is best for you. °You may also prevent community-acquired pneumonia if you take these actions: °· Get an influenza vaccine every year. Ask your health care provider which type of influenza vaccine is best for you. °· Go to the dentist on a regular basis. °· Wash your hands often. Use hand sanitizer if soap and water are not available. °SEEK MEDICAL CARE IF: °· You have a fever. °· You are losing sleep because you cannot control your cough with cough medicine. °SEEK IMMEDIATE MEDICAL CARE IF: °· You have worsening shortness of breath. °· You have increased chest pain. °· Your sickness becomes worse, especially if you are an older adult or have a weakened immune system. °· You cough up blood. °  °This information is not intended to replace advice given to you by your health care provider. Make sure you discuss any questions you have with your health care provider. °  °Document Released: 03/21/2005 Document Revised: 12/10/2014 Document Reviewed: 07/16/2014 °Elsevier Interactive Patient Education ©2016 [employer] ° °Sinusitis, Adult °Sinusitis is redness, soreness, and inflammation of the paranasal sinuses. Paranasal sinuses are air pockets within the bones of your face. They are located beneath your eyes, in the middle of your forehead, and above your eyes. In healthy paranasal sinuses, mucus is able to drain out, and air is able to circulate through them by way of your nose. However, when  your paranasal sinuses are inflamed, mucus and air can become trapped. This can allow bacteria and other germs to grow and cause infection. °Sinusitis can develop quickly and last only a short time (acute) or continue over a long period (chronic). Sinusitis that lasts for more than 12 weeks is considered chronic. °CAUSES °Causes of sinusitis include: °· Allergies. °· Structural abnormalities, such as displacement of the cartilage that separates your nostrils (deviated septum), which can decrease the air flow through your nose and sinuses and affect sinus drainage. °· Functional abnormalities, such as when the small hairs (cilia) that line your sinuses and help remove mucus do not work properly or are not present. °SIGNS AND SYMPTOMS °Symptoms of acute and chronic sinusitis are the same. The primary symptoms are pain and pressure around the affected sinuses. Other symptoms include: °· Upper toothache. °· Earache. °· Headache. °· Bad breath. °· Decreased sense of smell and taste. °· A cough, which worsens when you are lying flat. °· Fatigue. °· Fever. °· Thick drainage from your nose, which often is green and may contain pus (purulent). °· Swelling and warmth over the affected sinuses. °DIAGNOSIS °Your health care provider will perform a physical exam. During your exam, your health care provider may perform any of the following to help determine if you have acute sinusitis or chronic sinusitis: °· Look in your nose for signs of abnormal growths in your nostrils (nasal polyps). °· Tap over the affected sinus to check for signs of infection. °· View the inside of   your sinuses using an imaging device that has a light attached (endoscope). °If your health care provider suspects that you have chronic sinusitis, one or more of the following tests may be recommended: °· Allergy tests. °· Nasal culture. A sample of mucus is taken from your nose, sent to a lab, and screened for bacteria. °· Nasal cytology. A sample of mucus is  taken from your nose and examined by your health care provider to determine if your sinusitis is related to an allergy. °TREATMENT °Most cases of acute sinusitis are related to a viral infection and will resolve on their own within 10 days. Sometimes, medicines are prescribed to help relieve symptoms of both acute and chronic sinusitis. These may include pain medicines, decongestants, nasal steroid sprays, or saline sprays. °However, for sinusitis related to a bacterial infection, your health care provider will prescribe antibiotic medicines. These are medicines that will help kill the bacteria causing the infection. °Rarely, sinusitis is caused by a fungal infection. In these cases, your health care provider will prescribe antifungal medicine. °For some cases of chronic sinusitis, surgery is needed. Generally, these are cases in which sinusitis recurs more than 3 times per year, despite other treatments. °HOME CARE INSTRUCTIONS °· Drink plenty of water. Water helps thin the mucus so your sinuses can drain more easily. °· Use a humidifier. °· Inhale steam 3-4 times a day (for example, sit in the bathroom with the shower running). °· Apply a warm, moist washcloth to your face 3-4 times a day, or as directed by your health care provider. °· Use saline nasal sprays to help moisten and clean your sinuses. °· Take medicines only as directed by your health care provider. °· If you were prescribed either an antibiotic or antifungal medicine, finish it all even if you start to feel better. °SEEK IMMEDIATE MEDICAL CARE IF: °· You have increasing pain or severe headaches. °· You have nausea, vomiting, or drowsiness. °· You have swelling around your face. °· You have vision problems. °· You have a stiff neck. °· You have difficulty breathing. °  °This information is not intended to replace advice given to you by your health care provider. Make sure you discuss any questions you have with your health care provider. °  °Document  Released: 03/21/2005 Document Revised: 04/11/2014 Document Reviewed: 04/05/2011 °Elsevier Interactive Patient Education ©2016 Else[employer] Inc. ° °

## 2015-10-25 ENCOUNTER — Encounter (HOSPITAL_BASED_OUTPATIENT_CLINIC_OR_DEPARTMENT_OTHER): Payer: Self-pay | Admitting: *Deleted

## 2015-10-25 ENCOUNTER — Emergency Department (HOSPITAL_BASED_OUTPATIENT_CLINIC_OR_DEPARTMENT_OTHER)
Admission: EM | Admit: 2015-10-25 | Discharge: 2015-10-25 | Disposition: A | Discharge status: Home / Self Care | Payer: Managed Care, Other (non HMO) | Attending: Emergency Medicine | Admitting: Emergency Medicine

## 2015-10-25 DIAGNOSIS — M545 Low back pain, unspecified: Secondary | ICD-10-CM

## 2015-10-25 DIAGNOSIS — F17210 Nicotine dependence, cigarettes, uncomplicated: Secondary | ICD-10-CM | POA: Insufficient documentation

## 2015-10-25 DIAGNOSIS — J0191 Acute recurrent sinusitis, unspecified: Secondary | ICD-10-CM | POA: Diagnosis not present

## 2015-10-25 DIAGNOSIS — J3489 Other specified disorders of nose and nasal sinuses: Secondary | ICD-10-CM | POA: Diagnosis present

## 2015-10-25 DIAGNOSIS — Z79899 Other long term (current) drug therapy: Secondary | ICD-10-CM | POA: Insufficient documentation

## 2015-10-25 MED ORDER — LORATADINE 10 MG PO TABS: 10.0000 mg | ORAL_TABLET | Freq: Every day | ORAL | 0 refills | Status: AC

## 2015-10-25 MED ORDER — DEXAMETHASONE 6 MG PO TABS
16.0000 mg | ORAL_TABLET | Freq: Once | ORAL | Status: AC
  Administered 2015-10-25: 16 mg via ORAL
  Filled 2015-10-25: qty 1

## 2015-10-25 MED ORDER — HYDROCODONE-ACETAMINOPHEN 5-325 MG PO TABS
1.0000 | ORAL_TABLET | Freq: Once | ORAL | Status: DC
  Filled 2015-10-25: qty 1

## 2015-10-25 MED ORDER — MELOXICAM 7.5 MG PO TABS: 7.5000 mg | ORAL_TABLET | Freq: Every day | ORAL | 0 refills | Status: AC

## 2015-10-25 MED ORDER — KETOROLAC TROMETHAMINE 30 MG/ML IJ SOLN
30.0000 mg | Freq: Once | INTRAMUSCULAR | Status: AC
  Administered 2015-10-25: 30 mg via INTRAVENOUS
  Filled 2015-10-25: qty 1

## 2015-10-25 NOTE — ED Notes (Signed)
Pt reports he's in the process of moving and has some meds (Tramadol, Mobic, Claritin) are currently in storage.

## 2015-10-25 NOTE — ED Triage Notes (Signed)
Pt reports hx of back pain (pt states he works in [employer]). Reports flare-up in back pain this past Friday. Denies numbness/tingling,radiation down legs, incontinence. Also reports sinus pressure, cough and runny nose x2days. Denies known fever (endorses sweats/chills), sore throat, n/v/d.

## 2015-10-25 NOTE — ED Provider Notes (Signed)
[facility] Provider Note   CSN: [id number] Arrival date & time: 10/25/15  0803  First Provider Contact:  None       History   Chief Complaint Chief Complaint  Patient presents with  . Back Pain  . Facial Pain    HPI Herbert Shelton is a 40 y.o. male.  40 year old male here with 2 complaints. States he's had sinus pain, rhinorrhea, sore throat for a couple days. This is progressively improving with Sudafed and he took at home but has not gone away. Has a history of sinusitis but nothing severe. No fevers, sick contacts or recent travels. He also has a complaint of low back pain which is similar to previous. He is taking hydrocodone, Ultram and Modicon the past for this. No urinary incontinence, weakness, sensation changes. No trauma to his back.      History reviewed. No pertinent past medical history.  Patient Active Problem List   Diagnosis Date Noted  . Left knee injury 04/09/2014    History reviewed. No pertinent surgical history.     Home Medications    Prior to Admission medications   Medication Sig Start Date End Date Taking? Authorizing Provider  methocarbamol (ROBAXIN) 500 MG tablet Take 2 tablets (1,000 mg total) by mouth every 8 (eight) hours as needed for muscle spasms. 08/20/15  Yes Loren Racer, MD  benzonatate (TESSALON) 100 MG capsule Take 1 capsule (100 mg total) by mouth 3 (three) times daily as needed for cough. 08/20/15   Loren Racer, MD  ibuprofen (ADVIL,MOTRIN) 600 MG tablet Take 1 tablet (600 mg total) by mouth every 6 (six) hours as needed. 08/20/15   Loren Racer, MD  loratadine (CLARITIN) 10 MG tablet Take 1 tablet (10 mg total) by mouth daily. 10/25/15   Marily Memos, MD  meloxicam (MOBIC) 7.5 MG tablet Take 1 tablet (7.5 mg total) by mouth daily. 10/25/15   Marily Memos, MD    Family History No family history on file.  Social History Social History  Substance Use Topics  . Smoking status: Current Every Day Smoker    Types: Cigars, Cigarettes  . Smokeless tobacco: Never Used     Comment: 4-5 cigars per day  . Alcohol use 0.0 oz/week     Comment: 1-2x weekly     Allergies   Review of patient's allergies indicates no known allergies.   Review of Systems Review of Systems  All other systems reviewed and are negative.    Physical Exam Updated Vital Signs BP 115/85 (BP Location: Left Arm)   Pulse 90   Temp 98.7 F (37.1 C) (Oral)   Resp 20   Ht  (1.778 m)   Wt 170 lb (77.1 kg)   SpO2 100%   BMI 24.39 kg/m   Physical Exam  Constitutional: He appears well-developed and well-nourished.  HENT:  Head: Normocephalic and atraumatic.  Nose: Mucosal edema and rhinorrhea present. No sinus tenderness.  Mouth/Throat: Mucous membranes are not pale and not dry. Posterior oropharyngeal edema and posterior oropharyngeal erythema present. No tonsillar abscesses.  Eyes: Conjunctivae are normal.  Neck: Neck supple.  Cardiovascular: Normal rate and regular rhythm.   No murmur heard. Pulmonary/Chest: Effort normal and breath sounds normal. No respiratory distress.  Abdominal: Soft. He exhibits no mass. There is no tenderness. There is no guarding.  Musculoskeletal: He exhibits tenderness (bilateral lower back). He exhibits no edema or deformity.  Neurological: He is alert.  Skin: Skin is warm and dry. No erythema.  Psychiatric:  He has a normal mood and affect.  Nursing note and vitals reviewed.    ED Treatments / Results  Labs (all labs ordered are listed, but only abnormal results are displayed) Labs Reviewed - No data to display  EKG  EKG Interpretation None       Radiology No results found.  Procedures Procedures (including critical care time)  Medications Ordered in ED Medications  ketorolac (TORADOL) 30 MG/ML injection 30 mg (not administered)  HYDROcodone-acetaminophen (NORCO/VICODIN) 5-325 MG per tablet 1 tablet (not administered)  dexamethasone (DECADRON) tablet 16 mg  (not administered)     Initial Impression / Assessment and Plan / ED Course  I have reviewed the triage vital signs and the nursing notes.  Pertinent labs & imaging results that were available during my care of the patient were reviewed by me and considered in my medical decision making (see chart for details).  Clinical Course   40 year old male with acute sinusitis and likely an acute exacerbation of his chronic low back pain. We'll treat him here with nonsteroidal for his back pain. We will also give him Decadron for his sinusitis. No indication for antibiotics this time.    Final Clinical Impressions(s) / ED Diagnoses   Final diagnoses:  Bilateral low back pain without sciatica  Acute recurrent sinusitis, unspecified location    New Prescriptions Current Discharge Medication List       Marily Memos, MD 10/25/15 [phone]

## 2016-07-18 IMAGING — CR DG ORBITS FOR FOREIGN BODY
2 series · 2 of 2 positions shown · non-contrast
Comparison: None.

CLINICAL DATA: Metal working/exposure; clearance prior to MRI

EXAM:
ORBITS FOR FOREIGN BODY - 2 VIEW

[w waters * (1 of 2)]
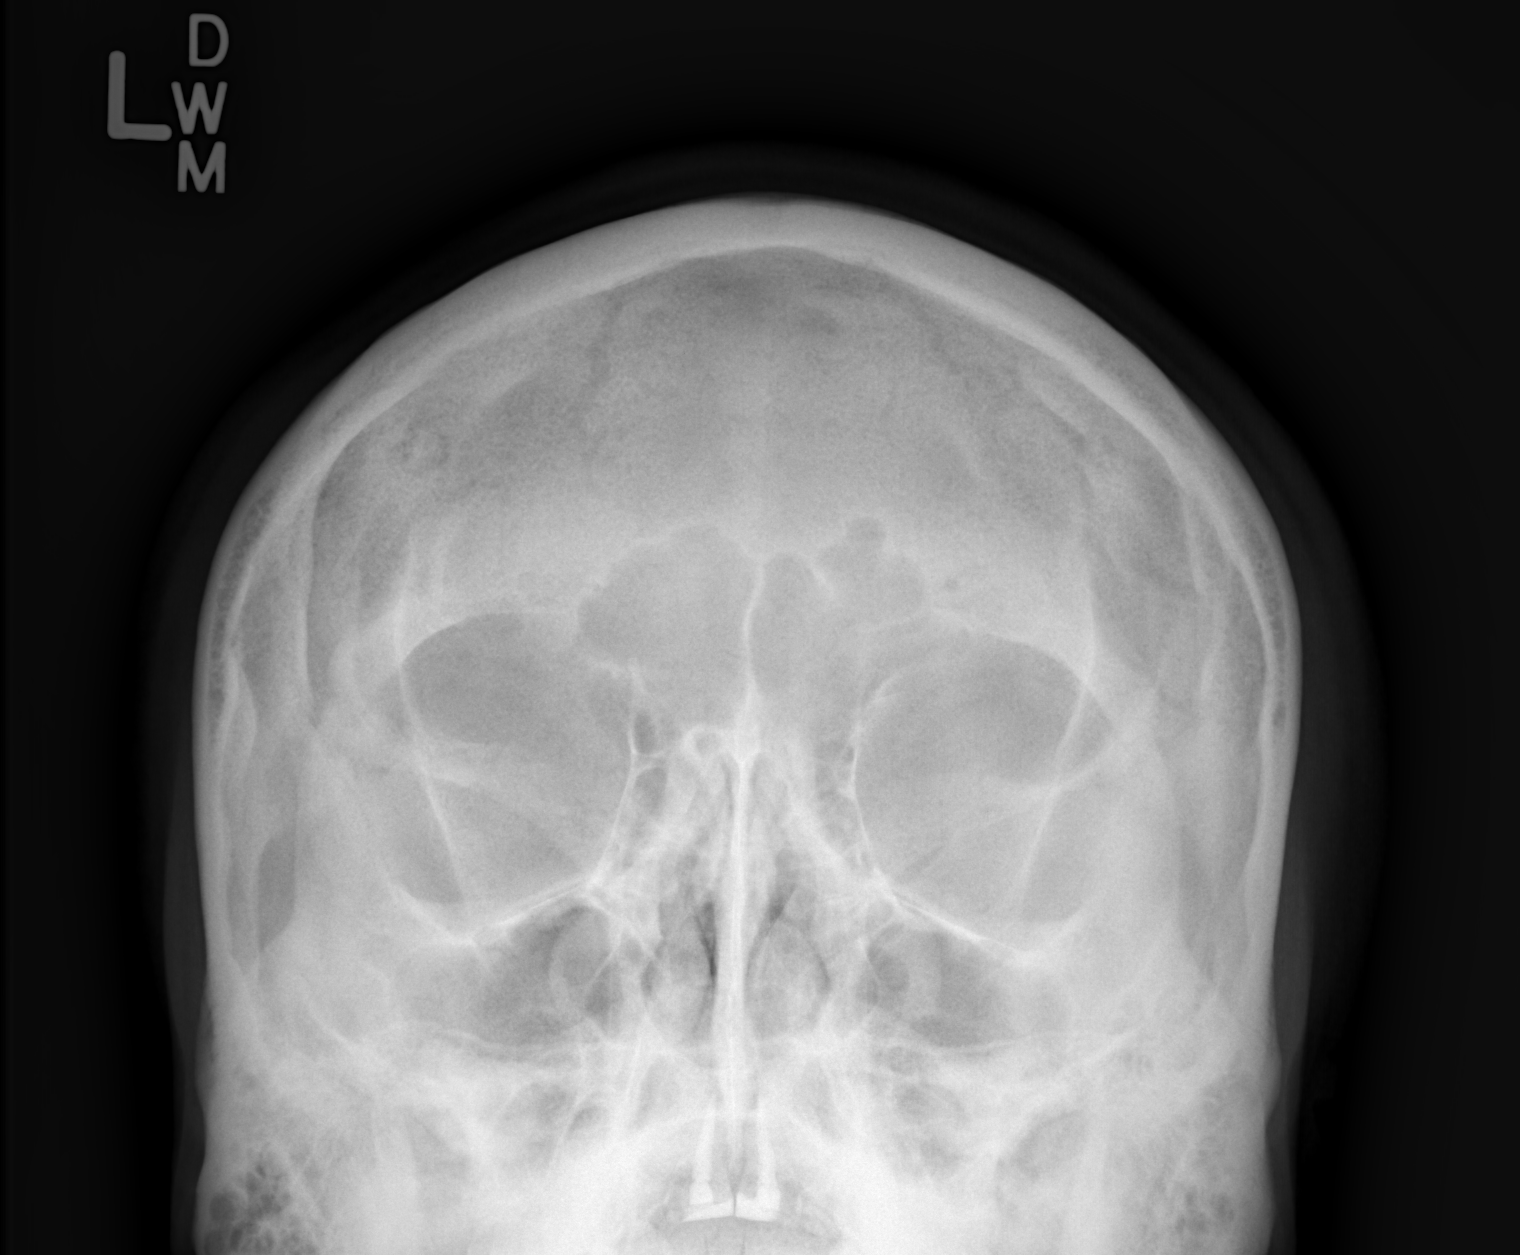

[w waters * (2 of 2)]
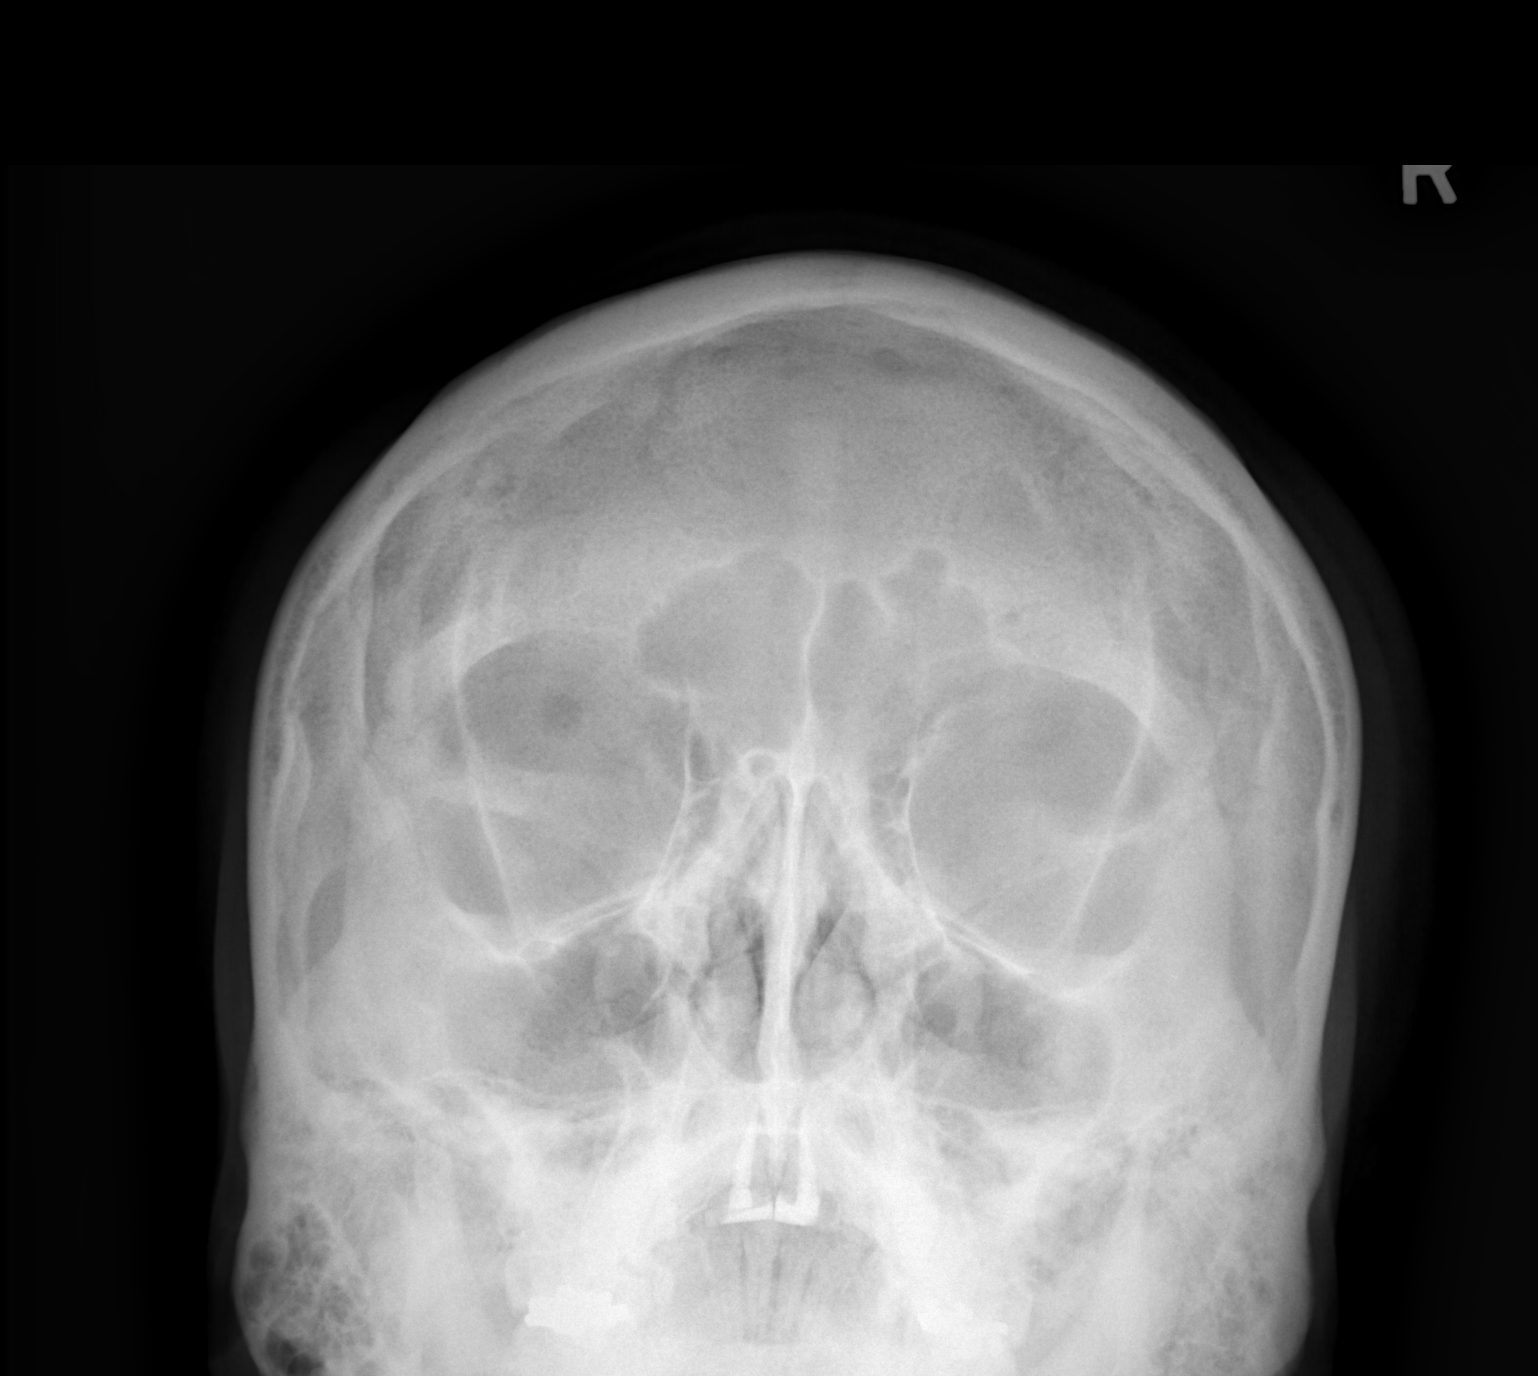

[2 of 2 positions shown; findings below may reference images not displayed]

FINDINGS: Water's views with eyes deviated toward the left and toward the
right were obtained. There is no intraorbital radiopaque foreign
body. No fracture or dislocation. Paranasal sinuses are clear.
IMPRESSION: No evidence of metallic foreign body within the orbits.

## 2018-01-20 ENCOUNTER — Emergency Department (HOSPITAL_BASED_OUTPATIENT_CLINIC_OR_DEPARTMENT_OTHER)
Admission: EM | Admit: 2018-01-20 | Discharge: 2018-01-20 | Disposition: A | Discharge status: Home / Self Care | Payer: Self-pay | Attending: Emergency Medicine | Admitting: Emergency Medicine

## 2018-01-20 ENCOUNTER — Encounter (HOSPITAL_BASED_OUTPATIENT_CLINIC_OR_DEPARTMENT_OTHER): Payer: Self-pay | Admitting: Emergency Medicine

## 2018-01-20 ENCOUNTER — Emergency Department (HOSPITAL_BASED_OUTPATIENT_CLINIC_OR_DEPARTMENT_OTHER): Payer: Self-pay

## 2018-01-20 ENCOUNTER — Other Ambulatory Visit: Payer: Self-pay

## 2018-01-20 DIAGNOSIS — Z79899 Other long term (current) drug therapy: Secondary | ICD-10-CM | POA: Insufficient documentation

## 2018-01-20 DIAGNOSIS — F17210 Nicotine dependence, cigarettes, uncomplicated: Secondary | ICD-10-CM | POA: Insufficient documentation

## 2018-01-20 DIAGNOSIS — B9789 Other viral agents as the cause of diseases classified elsewhere: Secondary | ICD-10-CM

## 2018-01-20 DIAGNOSIS — J069 Acute upper respiratory infection, unspecified: Secondary | ICD-10-CM

## 2018-01-20 MED ORDER — IBUPROFEN 400 MG PO TABS
600.0000 mg | ORAL_TABLET | Freq: Once | ORAL | Status: AC
  Administered 2018-01-20: 600 mg via ORAL
  Filled 2018-01-20: qty 1

## 2018-01-20 NOTE — ED Notes (Signed)
Pt was in a deep sleep and was difficult to awaken. Pt was awakened by sitting him in an upright position. Medication was then administered.

## 2018-01-20 NOTE — ED Triage Notes (Signed)
Patient states that he has had generalized aches and pains with a cough and nasal congestion for the last 2 -3 days

## 2018-01-20 NOTE — ED Notes (Signed)
Pt reports generalized body aches and pains. Pt reports he felt as though he had a fever but never did measure it. Pt report a productive cough, brown in nature.

## 2018-01-20 NOTE — ED Provider Notes (Signed)
[facility] EMERGENCY DEPARTMENT Provider Note   CSN: [id number] Arrival date & time: 01/20/18  1952     History   Chief Complaint Chief Complaint  Patient presents with  . Cough    HPI Severus Brodzinski is a 42 y.o. male who presents with body aches, cough, congestion. PMH significant for hx of pneumonia. He states he just arrived here from [facility]. He's had cough and congestion for the past couple days. Denies getting a flu shot this year. He does not want to participate in his history very much tonight and is a poor historian.  HPI  History reviewed. No pertinent past medical history.  Patient Active Problem List   Diagnosis Date Noted  . Left knee injury 04/09/2014    History reviewed. No pertinent surgical history.      Home Medications    Prior to Admission medications   Medication Sig Start Date End Date Taking? Authorizing Provider  benzonatate (TESSALON) 100 MG capsule Take 1 capsule (100 mg total) by mouth 3 (three) times daily as needed for cough. 08/20/15   Loren Racer, MD  ibuprofen (ADVIL,MOTRIN) 600 MG tablet Take 1 tablet (600 mg total) by mouth every 6 (six) hours as needed. 08/20/15   Loren Racer, MD  loratadine (CLARITIN) 10 MG tablet Take 1 tablet (10 mg total) by mouth daily. 10/25/15   Mesner, Barbara Cower, MD  meloxicam (MOBIC) 7.5 MG tablet Take 1 tablet (7.5 mg total) by mouth daily. 10/25/15   Mesner, Barbara Cower, MD  methocarbamol (ROBAXIN) 500 MG tablet Take 2 tablets (1,000 mg total) by mouth every 8 (eight) hours as needed for muscle spasms. 08/20/15   Loren Racer, MD    Family History History reviewed. No pertinent family history.  Social History Social History   Tobacco Use  . Smoking status: Current Every Day Smoker    Types: Cigars, Cigarettes  . Smokeless tobacco: Never Used  . Tobacco comment: 4-5 cigars per day  Substance Use Topics  . Alcohol use: Yes    Alcohol/week: 0.0 standard drinks    Comment: 1-2x weekly  . Drug  use: Yes    Types: Marijuana     Allergies   Patient has no known allergies.   Review of Systems Review of Systems  Constitutional: Positive for fever.  HENT: Positive for congestion and rhinorrhea. Negative for ear pain and sore throat.   Respiratory: Positive for cough. Negative for shortness of breath.   All other systems reviewed and are negative.    Physical Exam Updated Vital Signs BP 109/72 (BP Location: Left Arm)   Pulse (!) 116   Temp 99.6 F (37.6 C) (Oral)   Resp 18   Ht 5\' 10"  (1.778 m)   Wt 74.8 kg   SpO2 96%   BMI 23.68 kg/m   Physical Exam  Constitutional: He is oriented to person, place, and time. He appears well-developed and well-nourished. No distress.  Sleeping. Arousable but quickly falls back asleep and mumbles  HENT:  Head: Normocephalic and atraumatic.  Right Ear: Hearing, external ear and ear canal normal. Tympanic membrane is erythematous.  Left Ear: Hearing, external ear and ear canal normal. Tympanic membrane is erythematous.  Nose: Mucosal edema and rhinorrhea present.  Mouth/Throat: Uvula is midline, oropharynx is clear and moist and mucous membranes are normal.  Eyes: Pupils are equal, round, and reactive to light. Conjunctivae are normal. Right eye exhibits no discharge. Left eye exhibits no discharge. No scleral icterus.  Neck: Normal range of motion.  Cardiovascular: Normal rate and regular rhythm.  Pulmonary/Chest: Effort normal and breath sounds normal. No respiratory distress.  Abdominal: He exhibits no distension.  Neurological: He is alert and oriented to person, place, and time.  Skin: Skin is warm and dry.  Psychiatric: He has a normal mood and affect. His behavior is normal.  Nursing note and vitals reviewed.    ED Treatments / Results  Labs (all labs ordered are listed, but only abnormal results are displayed) Labs Reviewed - No data to display  EKG None  Radiology Dg Chest 2 View  Result Date:  01/20/2018 CLINICAL DATA:  Cough and congestion. EXAM: CHEST - 2 VIEW COMPARISON:  08/20/2015 FINDINGS: Cardiomediastinal silhouette is normal. Mediastinal contours appear intact. There is no evidence of focal airspace consolidation, pleural effusion or pneumothorax. Osseous structures are without acute abnormality. Soft tissues are grossly normal. IMPRESSION: No active cardiopulmonary disease. Electronically Signed   By: Ted Mcalpine M.D.   On: 01/20/2018 21:11    Procedures Procedures (including critical care time)  Medications Ordered in ED Medications - No data to display   Initial Impression / Assessment and Plan / ED Course  I have reviewed the triage vital signs and the nursing notes.  Pertinent labs & imaging results that were available during my care of the patient were reviewed by me and considered in my medical decision making (see chart for details).  42 year old male presents with cough and congestion for a couple days. Temp and HR are mildly elevated. Exam is consistent with viral illness. CXR is negative. Discussed with patient who did not seem very interested in discharge instructions. Advised rest, fluids, OTC meds as needed and was given a work note.  Final Clinical Impressions(s) / ED Diagnoses   Final diagnoses:  Viral URI with cough    ED Discharge Orders    None       Bethel Born, PA-C 01/20/18 [id number]    Geoffery Lyons, MD 01/20/18 [phone]

## 2018-01-20 NOTE — Discharge Instructions (Signed)
Please rest and drink plenty of fluids Take Ibuprofen or Tylenol for pain and fever Please return if you are worsening

## 2018-05-29 NOTE — Telephone Encounter (Signed)
-  closing encounter only
